# Patient Record
Sex: Male | Born: 1985 | Race: White | Hispanic: No | Marital: Married | Smoking: Never smoker
Health system: Southern US, Community
[De-identification: ages and names within clinical notes are randomized; demographics above are authoritative.]

## PROBLEM LIST (undated history)

## (undated) DIAGNOSIS — I82409 Acute embolism and thrombosis of unspecified deep veins of unspecified lower extremity: Secondary | ICD-10-CM

---

## 2007-01-23 HISTORY — PX: APPENDECTOMY: SHX54

## 2007-10-17 ENCOUNTER — Ambulatory Visit (HOSPITAL_BASED_OUTPATIENT_CLINIC_OR_DEPARTMENT_OTHER): Admission: RE | Admit: 2007-10-17 | Discharge: 2007-10-17 | Payer: Self-pay | Admitting: Urology

## 2010-06-06 NOTE — Op Note (Signed)
NAMEGILMAN, Jeffery Ferguson               ACCOUNT NO.:  [id number]   MEDICAL RECORD NO.:  [id number]          PATIENT TYPE:  AMB   LOCATION:  [facility]                         FACILITY:  [facility]   PHYSICIAN:  Ronald L. Earlene Plater, M.D.  DATE OF BIRTH:  [birth year]   DATE OF PROCEDURE:  10/17/2007  DATE OF DISCHARGE:                               OPERATIVE REPORT   DIAGNOSIS:  Urgency, frequency and bladder pain.   OPERATIVE PROCEDURE:  Cystourethroscopy.   SURGEON:  Gaynelle Arabian, M.D.   ANESTHESIA:  LMA.   ESTIMATED BLOOD LOSS:  Negligible.   TUBES:  None.   COMPLICATIONS:  None.   INDICATIONS FOR PROCEDURE:  [name] is a very nice 25 year old white male  who had a recent appendectomy.  He notes that he has had what he thinks  is latex allergy and had a latex Foley placed which was in April 2009.  He developed postoperative urinary retention.  He had 1.2 liters.  He  had to be catheterized.  Since then, he has urinated with good flow.  However, he has had some urgency, frequency and significant suprapubic  pressure and bladder pain.  Understanding risks, benefits and  alternatives, he has elected to proceed with cystourethroscopy, and he  was referred to have it done under anesthesia.   PROCEDURE IN DETAIL:  The patient was placed in supine position, after  proper LMA anesthesia, he was placed in a dorsal lithotomy position and  prepped and draped with Betadine in a sterile fashion.  Cystourethroscopy was performed with a 22.5 [place] Olympus panendoscope.  Urethra appeared to be normal.  Sphincter was normal.  Prostate was not  enlarged.  Bladder was smooth-walled.  Efflux of clear urine was noted  from normally placed ureteral orifices bilaterally.  There were no  lesions in the bladder or in the urethra.  The bladder was drained.  The  panendoscope was removed.  The patient was taken to recovery room  stable.  Plan is to place the patient on Cipro for 10 days and  Prosed/DS.  He may have a  low-grade prostatitis.  Prostate did not  appear to be inflamed but was slightly edematous which may have been  most particularly artifactual.  The patient tolerated the procedure  well.  Taken to recovery room stable.      Ronald L. Earlene Plater, M.D.  Electronically Signed     RLD/MEDQ  D:  10/17/2007  T:  10/18/2007  Job:  [id number]

## 2010-10-23 LAB — POCT HEMOGLOBIN-HEMACUE: Hemoglobin: 16.5

## 2011-05-28 DIAGNOSIS — M7989 Other specified soft tissue disorders: Secondary | ICD-10-CM | POA: Insufficient documentation

## 2011-05-28 DIAGNOSIS — H9319 Tinnitus, unspecified ear: Secondary | ICD-10-CM | POA: Insufficient documentation

## 2012-02-18 DIAGNOSIS — M545 Low back pain, unspecified: Secondary | ICD-10-CM | POA: Insufficient documentation

## 2012-02-18 DIAGNOSIS — M5430 Sciatica, unspecified side: Secondary | ICD-10-CM | POA: Insufficient documentation

## 2012-04-25 DIAGNOSIS — G5700 Lesion of sciatic nerve, unspecified lower limb: Secondary | ICD-10-CM | POA: Insufficient documentation

## 2012-04-25 DIAGNOSIS — J301 Allergic rhinitis due to pollen: Secondary | ICD-10-CM | POA: Insufficient documentation

## 2012-11-07 DIAGNOSIS — M26609 Unspecified temporomandibular joint disorder, unspecified side: Secondary | ICD-10-CM | POA: Insufficient documentation

## 2012-11-07 DIAGNOSIS — G4763 Sleep related bruxism: Secondary | ICD-10-CM | POA: Insufficient documentation

## 2012-11-07 DIAGNOSIS — G5700 Lesion of sciatic nerve, unspecified lower limb: Secondary | ICD-10-CM | POA: Insufficient documentation

## 2015-07-12 ENCOUNTER — Other Ambulatory Visit: Payer: Self-pay | Admitting: Specialist

## 2015-07-12 DIAGNOSIS — M5126 Other intervertebral disc displacement, lumbar region: Secondary | ICD-10-CM

## 2015-07-21 ENCOUNTER — Ambulatory Visit
Admission: RE | Admit: 2015-07-21 | Discharge: 2015-07-21 | Disposition: A | Discharge status: Home / Self Care | Payer: Commercial Managed Care - PPO | Source: Ambulatory Visit | Attending: Specialist | Admitting: Specialist

## 2015-07-21 ENCOUNTER — Other Ambulatory Visit: Payer: Self-pay | Admitting: Specialist

## 2015-07-21 ENCOUNTER — Inpatient Hospital Stay
Admission: RE | Admit: 2015-07-21 | Discharge: 2015-07-21 | Disposition: A | Discharge status: Home / Self Care | Payer: Self-pay | Source: Ambulatory Visit | Attending: Specialist | Admitting: Specialist

## 2015-07-21 DIAGNOSIS — R52 Pain, unspecified: Secondary | ICD-10-CM

## 2015-07-21 DIAGNOSIS — M5126 Other intervertebral disc displacement, lumbar region: Secondary | ICD-10-CM

## 2015-07-21 MED ORDER — IOPAMIDOL (ISOVUE-M 200) INJECTION 41%
15.0000 mL | Freq: Once | INTRAMUSCULAR | Status: AC
  Administered 2015-07-21: 15 mL via INTRATHECAL

## 2015-07-21 MED ORDER — MEPERIDINE HCL 100 MG/ML IJ SOLN
100.0000 mg | Freq: Once | INTRAMUSCULAR | Status: AC
  Administered 2015-07-21: 100 mg via INTRAMUSCULAR

## 2015-07-21 MED ORDER — ONDANSETRON HCL 4 MG/2ML IJ SOLN
4.0000 mg | Freq: Once | INTRAMUSCULAR | Status: AC
  Administered 2015-07-21: 4 mg via INTRAMUSCULAR

## 2015-07-21 MED ORDER — DIAZEPAM 5 MG PO TABS
10.0000 mg | ORAL_TABLET | Freq: Once | ORAL | Status: AC
  Administered 2015-07-21: 10 mg via ORAL

## 2015-07-21 NOTE — Discharge Instructions (Signed)

## 2015-08-02 ENCOUNTER — Other Ambulatory Visit: Payer: Self-pay | Admitting: Specialist

## 2015-08-02 DIAGNOSIS — M5126 Other intervertebral disc displacement, lumbar region: Secondary | ICD-10-CM

## 2015-08-03 ENCOUNTER — Ambulatory Visit
Admission: RE | Admit: 2015-08-03 | Discharge: 2015-08-03 | Disposition: A | Discharge status: Home / Self Care | Payer: Commercial Managed Care - PPO | Source: Ambulatory Visit | Attending: Specialist | Admitting: Specialist

## 2015-08-03 DIAGNOSIS — M5126 Other intervertebral disc displacement, lumbar region: Secondary | ICD-10-CM

## 2015-08-03 MED ORDER — METHYLPREDNISOLONE ACETATE 40 MG/ML INJ SUSP (RADIOLOG
120.0000 mg | Freq: Once | INTRAMUSCULAR | Status: AC
  Administered 2015-08-03: 120 mg via EPIDURAL

## 2015-08-03 MED ORDER — IOPAMIDOL (ISOVUE-M 200) INJECTION 41%
1.0000 mL | Freq: Once | INTRAMUSCULAR | Status: AC
  Administered 2015-08-03: 1 mL via EPIDURAL

## 2015-08-03 NOTE — Discharge Instructions (Signed)

## 2015-10-17 ENCOUNTER — Inpatient Hospital Stay (HOSPITAL_COMMUNITY)
Admission: EM | Admit: 2015-10-17 | Discharge: 2015-10-24 | DRG: 099 | Disposition: A | Discharge status: Home / Self Care | Payer: Commercial Managed Care - PPO | Attending: Internal Medicine | Admitting: Internal Medicine

## 2015-10-17 ENCOUNTER — Encounter (HOSPITAL_COMMUNITY): Payer: Self-pay | Admitting: Emergency Medicine

## 2015-10-17 ENCOUNTER — Emergency Department (HOSPITAL_COMMUNITY): Payer: Commercial Managed Care - PPO

## 2015-10-17 DIAGNOSIS — G0490 Encephalitis and encephalomyelitis, unspecified: Secondary | ICD-10-CM | POA: Diagnosis present

## 2015-10-17 DIAGNOSIS — Z881 Allergy status to other antibiotic agents status: Secondary | ICD-10-CM

## 2015-10-17 DIAGNOSIS — G629 Polyneuropathy, unspecified: Secondary | ICD-10-CM | POA: Diagnosis present

## 2015-10-17 DIAGNOSIS — G039 Meningitis, unspecified: Secondary | ICD-10-CM | POA: Diagnosis not present

## 2015-10-17 DIAGNOSIS — G971 Other reaction to spinal and lumbar puncture: Secondary | ICD-10-CM | POA: Diagnosis not present

## 2015-10-17 DIAGNOSIS — Y844 Aspiration of fluid as the cause of abnormal reaction of the patient, or of later complication, without mention of misadventure at the time of the procedure: Secondary | ICD-10-CM | POA: Diagnosis not present

## 2015-10-17 DIAGNOSIS — Z86718 Personal history of other venous thrombosis and embolism: Secondary | ICD-10-CM

## 2015-10-17 DIAGNOSIS — Z79899 Other long term (current) drug therapy: Secondary | ICD-10-CM

## 2015-10-17 DIAGNOSIS — Z880 Allergy status to penicillin: Secondary | ICD-10-CM

## 2015-10-17 DIAGNOSIS — Z720 Tobacco use: Secondary | ICD-10-CM

## 2015-10-17 HISTORY — DX: Acute embolism and thrombosis of unspecified deep veins of unspecified lower extremity: I82.409

## 2015-10-17 LAB — COMPREHENSIVE METABOLIC PANEL
ALK PHOS: 64 U/L (ref 38–126)
ALT: 35 U/L (ref 17–63)
AST: 30 U/L (ref 15–41)
Albumin: 4.4 g/dL (ref 3.5–5.0)
Anion gap: 10 (ref 5–15)
BILIRUBIN TOTAL: 0.8 mg/dL (ref 0.3–1.2)
BUN: 12 mg/dL (ref 6–20)
CALCIUM: 9.6 mg/dL (ref 8.9–10.3)
CHLORIDE: 105 mmol/L (ref 101–111)
CO2: 24 mmol/L (ref 22–32)
CREATININE: 0.85 mg/dL (ref 0.61–1.24)
Glucose, Bld: 104 mg/dL — ABNORMAL HIGH (ref 65–99)
Potassium: 4.1 mmol/L (ref 3.5–5.1)
Sodium: 139 mmol/L (ref 135–145)
Total Protein: 7 g/dL (ref 6.5–8.1)

## 2015-10-17 LAB — URINALYSIS, ROUTINE W REFLEX MICROSCOPIC
BILIRUBIN URINE: NEGATIVE
Glucose, UA: NEGATIVE mg/dL
HGB URINE DIPSTICK: NEGATIVE
Ketones, ur: NEGATIVE mg/dL
Leukocytes, UA: NEGATIVE
NITRITE: NEGATIVE
PROTEIN: NEGATIVE mg/dL
Specific Gravity, Urine: 1.025 (ref 1.005–1.030)
pH: 8 (ref 5.0–8.0)

## 2015-10-17 LAB — CSF CELL COUNT WITH DIFFERENTIAL
Eosinophils, CSF: 0 % (ref 0–1)
Eosinophils, CSF: 0 % (ref 0–1)
Lymphs, CSF: 4 % — ABNORMAL LOW (ref 40–80)
Lymphs, CSF: 9 % — ABNORMAL LOW (ref 40–80)
Monocyte-Macrophage-Spinal Fluid: 7 % — ABNORMAL LOW (ref 15–45)
Monocyte-Macrophage-Spinal Fluid: 7 % — ABNORMAL LOW (ref 15–45)
RBC Count, CSF: 1 /mm3 — ABNORMAL HIGH
RBC Count, CSF: 1 /mm3 — ABNORMAL HIGH
Segmented Neutrophils-CSF: 84 % — ABNORMAL HIGH (ref 0–6)
Segmented Neutrophils-CSF: 89 % — ABNORMAL HIGH (ref 0–6)
Tube #: 1
Tube #: 4
WBC, CSF: 24 /mm3 (ref 0–5)
WBC, CSF: 24 /mm3 (ref 0–5)

## 2015-10-17 LAB — CBC WITH DIFFERENTIAL/PLATELET
BASOS PCT: 0 %
Basophils Absolute: 0 10*3/uL (ref 0.0–0.1)
EOS ABS: 0 10*3/uL (ref 0.0–0.7)
EOS PCT: 0 %
HCT: 46.5 % (ref 39.0–52.0)
Hemoglobin: 15.7 g/dL (ref 13.0–17.0)
LYMPHS ABS: 1.9 10*3/uL (ref 0.7–4.0)
Lymphocytes Relative: 20 %
MCH: 30.3 pg (ref 26.0–34.0)
MCHC: 33.8 g/dL (ref 30.0–36.0)
MCV: 89.6 fL (ref 78.0–100.0)
Monocytes Absolute: 0.5 10*3/uL (ref 0.1–1.0)
Monocytes Relative: 6 %
Neutro Abs: 7 10*3/uL (ref 1.7–7.7)
Neutrophils Relative %: 74 %
PLATELETS: 231 10*3/uL (ref 150–400)
RBC: 5.19 MIL/uL (ref 4.22–5.81)
RDW: 12.7 % (ref 11.5–15.5)
WBC: 9.5 10*3/uL (ref 4.0–10.5)

## 2015-10-17 LAB — PROTEIN, CSF: Total  Protein, CSF: 54 mg/dL — ABNORMAL HIGH (ref 15–45)

## 2015-10-17 LAB — GLUCOSE, CSF: Glucose, CSF: 59 mg/dL (ref 40–70)

## 2015-10-17 LAB — I-STAT CG4 LACTIC ACID, ED: Lactic Acid, Venous: 1.68 mmol/L (ref 0.5–1.9)

## 2015-10-17 MED ORDER — PROCHLORPERAZINE EDISYLATE 5 MG/ML IJ SOLN
10.0000 mg | Freq: Once | INTRAMUSCULAR | Status: AC
  Administered 2015-10-17: 10 mg via INTRAVENOUS
  Filled 2015-10-17: qty 2

## 2015-10-17 MED ORDER — SODIUM CHLORIDE 0.9 % IV SOLN
1.0000 g | Freq: Once | INTRAVENOUS | Status: DC
  Filled 2015-10-17: qty 1

## 2015-10-17 MED ORDER — KETOROLAC TROMETHAMINE 15 MG/ML IJ SOLN
15.0000 mg | Freq: Once | INTRAMUSCULAR | Status: AC
  Administered 2015-10-17: 15 mg via INTRAVENOUS
  Filled 2015-10-17: qty 1

## 2015-10-17 MED ORDER — VANCOMYCIN HCL 10 G IV SOLR
1500.0000 mg | Freq: Once | INTRAVENOUS | Status: AC
  Administered 2015-10-17: 1500 mg via INTRAVENOUS
  Filled 2015-10-17: qty 1500

## 2015-10-17 MED ORDER — LIDOCAINE HCL (PF) 1 % IJ SOLN
5.0000 mL | Freq: Once | INTRAMUSCULAR | Status: AC
  Administered 2015-10-17: 5 mL
  Filled 2015-10-17: qty 5

## 2015-10-17 MED ORDER — SODIUM CHLORIDE 0.9 % IV BOLUS (SEPSIS)
1000.0000 mL | Freq: Once | INTRAVENOUS | Status: AC
  Administered 2015-10-17: 1000 mL via INTRAVENOUS

## 2015-10-17 MED ORDER — DEXAMETHASONE SODIUM PHOSPHATE 10 MG/ML IJ SOLN
10.0000 mg | Freq: Once | INTRAMUSCULAR | Status: AC
  Administered 2015-10-17: 10 mg via INTRAVENOUS
  Filled 2015-10-17: qty 1

## 2015-10-17 MED ORDER — SODIUM CHLORIDE 0.9 % IV SOLN
2.0000 g | Freq: Once | INTRAVENOUS | Status: AC
  Administered 2015-10-18: 2 g via INTRAVENOUS
  Filled 2015-10-17: qty 2

## 2015-10-17 MED ORDER — DIPHENHYDRAMINE HCL 50 MG/ML IJ SOLN
25.0000 mg | Freq: Once | INTRAMUSCULAR | Status: AC
  Administered 2015-10-17: 25 mg via INTRAVENOUS
  Filled 2015-10-17: qty 1

## 2015-10-17 NOTE — ED Triage Notes (Signed)
Pt sts frontal HA with N/V and some chills starting this am; pt had recent hx of strep

## 2015-10-17 NOTE — ED Provider Notes (Addendum)
[facility]-EMERGENCY DEPT Provider Note   CSN: [id number] Arrival date & time: 10/17/15  1758  By signing my name below, I, Jeffery Ferguson, attest that this documentation has been prepared under the direction and in the presence of Jeffery Razor, MD . Electronically Signed: Freida Ferguson, Scribe. 10/17/2015. 8:12 PM.   History   Chief Complaint Chief Complaint  Patient presents with  . Headache    The history is provided by the patient. No language interpreter was used.     HPI Comments:  Jeffery Ferguson is a 30 y.o. male who presents to the Emergency Department complaining of progressively worsening HA that he woke up with this AM ~ 0430. He describes a pulsing pain to the front of his head. He also he notes eye pain when he looks around. Pt went to Urgent Care this afternoon for his pain. He received a shot of Toradol which provided no relief and was advised to come to the ED for further evaluation.  Pt reports associated photophobia, 2 episodes of vomiting today, an episode of dizziness, and chills. He states he has been eating and drinking as he normally would. Mother reports syncopal episode this afternoon that lasted a few minutes. Pt denies fever, vision change, and sore throat. He has been off blood thinners x 2 months and recently finished a 5 day course of amoxicillin for strep.    Past Medical History:  Diagnosis Date  . DVT (deep venous thrombosis) [facility])     Patient Active Problem List   Diagnosis Date Noted  . 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 Piriformis syndrome 11/07/2012  . 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 Hay fever 04/25/2012  . Lesion of sciatic nerve 04/25/2012  . LBP (low back pain) 02/18/2012  . Neuralgia neuritis, sciatic nerve 02/18/2012  . Soft tissue lesion of shoulder region 05/28/2011  . Buzzing in ear 05/28/2011    History reviewed. No pertinent surgical history.     Home Medications    Prior to Admission medications   Medication  Sig Start Date End Date Taking? Authorizing Provider  metaxalone (SKELAXIN) 800 MG tablet Take 800 mg by mouth at bedtime as needed for muscle spasms.    Yes Historical Provider, MD  omeprazole (PRILOSEC) 20 MG capsule Take 20 mg by mouth 2 (two) times daily. 08/11/15  Yes Historical Provider, MD  traMADol (ULTRAM) 50 MG tablet Take 100 mg by mouth 3 (three) times daily as needed for moderate pain.  08/11/15  Yes Historical Provider, MD    Family History History reviewed. No pertinent family history.  Social History Social History  Substance Use Topics  . Smoking status: Never Smoker  . Smokeless tobacco: Never Used  . Alcohol use 0.0 oz/week     Comment: occ     Allergies   Penicillins and Cephalexin   Review of Systems Review of Systems  Constitutional: Positive for chills. Negative for fever.  HENT: Negative for sore throat.   Eyes: Positive for photophobia. Negative for visual disturbance.  Gastrointestinal: Positive for nausea and vomiting.  Neurological: Positive for dizziness, syncope and headaches.  All other systems reviewed and are negative.   Physical Exam Updated Vital Signs BP 118/70   Pulse 67   Temp 99.7 F (37.6 C) (Oral)   Resp 15   SpO2 96%   Physical Exam  Constitutional: He is oriented to person, place, and time. He appears well-developed and well-nourished.  Pt laying in bed with lights off; appears uncomfortable  HENT:  Head: Normocephalic  and atraumatic.  Mouth/Throat: Oropharynx is clear and moist. No oropharyngeal exudate.  Eyes: Conjunctivae and EOM are normal. Pupils are equal, round, and reactive to light. Right eye exhibits no discharge. Left eye exhibits no discharge.  Wouldn't tolerate fundoscopic exam  Neck: Normal range of motion. Neck supple. No neck rigidity. No Brudzinski's sign and no Kernig's sign noted.  Cardiovascular: Normal rate, regular rhythm and normal heart sounds.   No murmur heard. Pulmonary/Chest: Effort normal and  breath sounds normal. No respiratory distress.  Abdominal: Soft. He exhibits no distension. There is no tenderness.  Lymphadenopathy:    He has no cervical adenopathy.  Neurological: He is alert and oriented to person, place, and time. No cranial nerve deficit. He exhibits normal muscle tone. Coordination normal.  Normal strength and sensation to all extremities  Skin: Skin is warm and dry.  Psychiatric: He has a normal mood and affect. His behavior is normal.  Nursing note and vitals reviewed.    ED Treatments / Results  DIAGNOSTIC STUDIES:  Oxygen Saturation is 96% on RA, normal by my interpretation.    COORDINATION OF CARE:  8:06 PM Discussed risks and benefits of LP with pt and family at bedside and pt agrees to treatment plan.   Labs (all labs ordered are listed, but only abnormal results are displayed) Labs Reviewed  COMPREHENSIVE METABOLIC PANEL - Abnormal; Notable for the following:       Result Value   Glucose, Bld 104 (*)    All other components within normal limits  CSF CELL COUNT WITH DIFFERENTIAL - Abnormal; Notable for the following:    RBC Count, CSF 1 (*)    WBC, CSF 24 (*)    Segmented Neutrophils-CSF 84 (*)    Lymphs, CSF 9 (*)    Monocyte-Macrophage-Spinal Fluid 7 (*)    All other components within normal limits  CSF CELL COUNT WITH DIFFERENTIAL - Abnormal; Notable for the following:    RBC Count, CSF 1 (*)    WBC, CSF 24 (*)    Segmented Neutrophils-CSF 89 (*)    Lymphs, CSF 4 (*)    Monocyte-Macrophage-Spinal Fluid 7 (*)    All other components within normal limits  PROTEIN, CSF - Abnormal; Notable for the following:    Total  Protein, CSF 54 (*)    All other components within normal limits  CSF CULTURE  GRAM STAIN  CBC WITH DIFFERENTIAL/PLATELET  URINALYSIS, ROUTINE W REFLEX MICROSCOPIC (NOT AT [facility])  GLUCOSE, CSF  RAPID HIV SCREEN (HIV 1/2 AB+AG)  VDRL, CSF  HERPES SIMPLEX VIRUS(HSV) DNA BY PCR  CRYPTOCOCCAL ANTIGEN, CSF  HIV 1 RNA QUANT-NO  REFLEX-BLD  I-STAT CG4 LACTIC ACID, ED    EKG  EKG Interpretation  Date/Time:  Monday October 17 2015 20:15:33 EDT Ventricular Rate:  74 PR Interval:    QRS Duration: 93 QT Interval:  379 QTC Calculation: 421 R Axis:   82 Text Interpretation:  Sinus rhythm No old tracing to compare Confirmed by Ksenia Kunz  MD, Tinia Oravec [phone]) on 10/17/2015 8:34:49 PM       Radiology Ct Head Wo Contrast  Result Date: 10/17/2015 CLINICAL DATA:  30 y/o M; headache with chills, nausea, and vomiting. EXAM: CT HEAD WITHOUT CONTRAST TECHNIQUE: Contiguous axial images were obtained from the base of the skull through the vertex without intravenous contrast. COMPARISON:  None. FINDINGS: Brain: No evidence of acute infarction, hemorrhage, hydrocephalus, extra-axial collection or mass lesion/mass effect. Vascular: No hyperdense vessel or unexpected calcification. Skull: Normal. Negative for fracture  or focal lesion. Sinuses/Orbits: No acute finding. Other: None. IMPRESSION: No acute intracranial abnormality is identified. Unremarkable CT of head for age. Electronically Signed   By: Mitzi HansenLance  Furusawa-Stratton M.D.   On: 10/17/2015 20:17    Procedures .Lumbar Puncture Date/Time: 10/17/2015 9:15 PM Performed by: Jeffery RazorKOHUT, Asenath Balash Authorized by: Jeffery RazorKOHUT, Rebel Willcutt   Consent:    Consent obtained:  Verbal   Consent given by:  Patient   Risks discussed:  Pain, infection, bleeding and headache Pre-procedure details:    Procedure purpose:  Diagnostic Anesthesia (see MAR for exact dosages):    Anesthesia method:  Local infiltration   Local anesthetic:  Lidocaine 1% w/o epi Procedure details:    Lumbar space:  L3-L4 interspace   Patient position:  L lateral decubitus   Needle gauge:  22   Fluid appearance:  Clear   Tubes of fluid:  4   Total volume (ml):  6 Post-procedure:    Puncture site:  Adhesive bandage applied   Patient tolerance of procedure:  Tolerated well, no immediate complications     CRITICAL  CARE Performed by: Jeffery RazorKOHUT, Pratham Cassatt Total critical care time: 35 minutes Critical care time was exclusive of separately billable procedures and treating other patients. Critical care was necessary to treat or prevent imminent or life-threatening deterioration. Critical care was time spent personally by me on the following activities: development of treatment plan with patient and/or surrogate as well as nursing, discussions with consultants, evaluation of patient's response to treatment, examination of patient, obtaining history from patient or surrogate, ordering and performing treatments and interventions, ordering and review of laboratory studies, ordering and review of radiographic studies, pulse oximetry and re-evaluation of patient's condition.  Medications Ordered in ED Medications  vancomycin (VANCOCIN) 1,500 mg in sodium chloride 0.9 % 500 mL IVPB (1,500 mg Intravenous New Bag/Given 10/17/15 2322)  meropenem (MERREM) 2 g in sodium chloride 0.9 % 100 mL IVPB (not administered)  sodium chloride 0.9 % bolus 1,000 mL (0 mLs Intravenous Stopped 10/17/15 2059)  diphenhydrAMINE (BENADRYL) injection 25 mg (25 mg Intravenous Given 10/17/15 2025)  prochlorperazine (COMPAZINE) injection 10 mg (10 mg Intravenous Given 10/17/15 2023)  ketorolac (TORADOL) 15 MG/ML injection 15 mg (15 mg Intravenous Given 10/17/15 2024)  lidocaine (PF) (XYLOCAINE) 1 % injection 5 mL (5 mLs Other Given 10/17/15 2059)  dexamethasone (DECADRON) injection 10 mg (10 mg Intravenous Given 10/17/15 2319)     Initial Impression / Assessment and Plan / ED Course  I have reviewed the triage vital signs and the nursing notes.  Pertinent labs & imaging results that were available during my care of the patient were reviewed by me and considered in my medical decision making (see chart for details).  Clinical Course    30yM with HA. Nonfocal neuro exam. Subjectively reports increased pain in occipital region with ranging neck and  abducting shoulders, but no rigidity on exam. Afebrile but reported NSAIDs prior to coming to ED. CT head negative.  LP with elevated WBCs. Only 24, but neutrophils 84/89. Protein mildly elevated.  CSF glucose normal at ~2/3 blood level. I'm not sure how much 5d of amoxicillin ending 8 days ago effects things, if at all.  Will cover for possible bacterial meningitis. Keflex allergy listed as hives. Discussed with pharmacy and recommending meropenem. Also vanc and decadron. Will discuss with ID. Lives with 9w, 2y children and wife.   Discussed with ID, Dr Algis LimingVanDam. Pt uncomfortable, but not toxic. CSF WBC only 24. Amoxicillin finished 8 days ago is probably  a non-factor. Probably not bacterial meningitis. Additional studies sent per recommendations. Not recommending prophylaxis for close contacts.   Final Clinical Impressions(s) / ED Diagnoses   Final diagnoses:  Meningitis    New Prescriptions New Prescriptions   No medications on file   I personally preformed the services scribed in my presence. The recorded information has been reviewed is accurate. Jeffery Razor, MD.         Jeffery Razor, MD 10/18/15 [phone]

## 2015-10-18 ENCOUNTER — Encounter (HOSPITAL_COMMUNITY): Payer: Self-pay | Admitting: Family Medicine

## 2015-10-18 DIAGNOSIS — G0481 Other encephalitis and encephalomyelitis: Secondary | ICD-10-CM | POA: Diagnosis not present

## 2015-10-18 DIAGNOSIS — G0490 Encephalitis and encephalomyelitis, unspecified: Secondary | ICD-10-CM | POA: Diagnosis present

## 2015-10-18 DIAGNOSIS — Z720 Tobacco use: Secondary | ICD-10-CM | POA: Diagnosis not present

## 2015-10-18 DIAGNOSIS — G039 Meningitis, unspecified: Secondary | ICD-10-CM | POA: Diagnosis present

## 2015-10-18 DIAGNOSIS — G971 Other reaction to spinal and lumbar puncture: Secondary | ICD-10-CM | POA: Diagnosis not present

## 2015-10-18 DIAGNOSIS — Z881 Allergy status to other antibiotic agents status: Secondary | ICD-10-CM | POA: Diagnosis not present

## 2015-10-18 DIAGNOSIS — G4489 Other headache syndrome: Secondary | ICD-10-CM | POA: Diagnosis not present

## 2015-10-18 DIAGNOSIS — G629 Polyneuropathy, unspecified: Secondary | ICD-10-CM | POA: Diagnosis present

## 2015-10-18 DIAGNOSIS — Z880 Allergy status to penicillin: Secondary | ICD-10-CM | POA: Diagnosis not present

## 2015-10-18 DIAGNOSIS — Z79899 Other long term (current) drug therapy: Secondary | ICD-10-CM | POA: Diagnosis not present

## 2015-10-18 DIAGNOSIS — G6289 Other specified polyneuropathies: Secondary | ICD-10-CM | POA: Diagnosis not present

## 2015-10-18 DIAGNOSIS — Z86718 Personal history of other venous thrombosis and embolism: Secondary | ICD-10-CM | POA: Diagnosis not present

## 2015-10-18 DIAGNOSIS — Y844 Aspiration of fluid as the cause of abnormal reaction of the patient, or of later complication, without mention of misadventure at the time of the procedure: Secondary | ICD-10-CM | POA: Diagnosis not present

## 2015-10-18 LAB — BASIC METABOLIC PANEL
ANION GAP: 9 (ref 5–15)
BUN: 10 mg/dL (ref 6–20)
CALCIUM: 9.3 mg/dL (ref 8.9–10.3)
CHLORIDE: 104 mmol/L (ref 101–111)
CO2: 25 mmol/L (ref 22–32)
Creatinine, Ser: 0.95 mg/dL (ref 0.61–1.24)
GFR calc Af Amer: >60 mL/min (ref >60)
GFR calc non Af Amer: >60 mL/min (ref >60)
GLUCOSE: 142 mg/dL — AB (ref 65–99)
POTASSIUM: 4.4 mmol/L (ref 3.5–5.1)
Sodium: 138 mmol/L (ref 135–145)

## 2015-10-18 LAB — CBC
HEMATOCRIT: 46 % (ref 39.0–52.0)
HEMOGLOBIN: 15.7 g/dL (ref 13.0–17.0)
MCH: 30.9 pg (ref 26.0–34.0)
MCHC: 34.1 g/dL (ref 30.0–36.0)
MCV: 90.6 fL (ref 78.0–100.0)
Platelets: 229 10*3/uL (ref 150–400)
RBC: 5.08 MIL/uL (ref 4.22–5.81)
RDW: 12.9 % (ref 11.5–15.5)
WBC: 8.5 10*3/uL (ref 4.0–10.5)

## 2015-10-18 LAB — RAPID HIV SCREEN (HIV 1/2 AB+AG)
HIV 1/2 Antibodies: NONREACTIVE
HIV-1 P24 ANTIGEN - HIV24: NONREACTIVE

## 2015-10-18 LAB — CRYPTOCOCCAL ANTIGEN, CSF: Crypto Ag: NEGATIVE

## 2015-10-18 LAB — PATHOLOGIST SMEAR REVIEW: Path Review: INCREASED

## 2015-10-18 MED ORDER — ACETAMINOPHEN 650 MG RE SUPP
650.0000 mg | Freq: Four times a day (QID) | RECTAL | Status: DC | PRN
Start: 2015-10-18 — End: 2015-10-24

## 2015-10-18 MED ORDER — KETOROLAC TROMETHAMINE 15 MG/ML IJ SOLN
15.0000 mg | Freq: Four times a day (QID) | INTRAMUSCULAR | Status: DC | PRN
  Administered 2015-10-18 – 2015-10-19 (×4): 15 mg via INTRAVENOUS
  Filled 2015-10-18 (×4): qty 1

## 2015-10-18 MED ORDER — ONDANSETRON HCL 4 MG/2ML IJ SOLN
4.0000 mg | Freq: Four times a day (QID) | INTRAMUSCULAR | Status: DC | PRN
  Administered 2015-10-21 – 2015-10-24 (×4): 4 mg via INTRAVENOUS
  Filled 2015-10-18 (×4): qty 2

## 2015-10-18 MED ORDER — ONDANSETRON HCL 4 MG PO TABS: 4.0000 mg | ORAL_TABLET | Freq: Four times a day (QID) | ORAL | Status: DC | PRN

## 2015-10-18 MED ORDER — ENOXAPARIN SODIUM 40 MG/0.4ML ~~LOC~~ SOLN
40.0000 mg | SUBCUTANEOUS | Status: DC
  Administered 2015-10-18 – 2015-10-23 (×6): 40 mg via SUBCUTANEOUS
  Filled 2015-10-18 (×7): qty 0.4

## 2015-10-18 MED ORDER — INFLUENZA VAC SPLIT QUAD 0.5 ML IM SUSY
0.5000 mL | PREFILLED_SYRINGE | INTRAMUSCULAR | Status: AC
  Administered 2015-10-19: 0.5 mL via INTRAMUSCULAR
  Filled 2015-10-18: qty 0.5

## 2015-10-18 MED ORDER — ACETAMINOPHEN 325 MG PO TABS
650.0000 mg | ORAL_TABLET | Freq: Four times a day (QID) | ORAL | Status: DC | PRN
  Administered 2015-10-18 – 2015-10-24 (×7): 650 mg via ORAL
  Filled 2015-10-18 (×7): qty 2

## 2015-10-18 MED ORDER — VANCOMYCIN HCL IN DEXTROSE 1-5 GM/200ML-% IV SOLN
1000.0000 mg | Freq: Three times a day (TID) | INTRAVENOUS | Status: DC
  Administered 2015-10-18 – 2015-10-21 (×9): 1000 mg via INTRAVENOUS
  Filled 2015-10-18 (×13): qty 200

## 2015-10-18 MED ORDER — SENNOSIDES-DOCUSATE SODIUM 8.6-50 MG PO TABS
1.0000 | ORAL_TABLET | Freq: Every evening | ORAL | Status: DC | PRN
  Administered 2015-10-20: 1 via ORAL
  Filled 2015-10-18: qty 1

## 2015-10-18 MED ORDER — IBUPROFEN 600 MG PO TABS
600.0000 mg | ORAL_TABLET | Freq: Four times a day (QID) | ORAL | Status: DC | PRN
  Administered 2015-10-18: 600 mg via ORAL
  Filled 2015-10-18: qty 1

## 2015-10-18 MED ORDER — SODIUM CHLORIDE 0.9 % IV SOLN
2.0000 g | Freq: Three times a day (TID) | INTRAVENOUS | Status: DC
  Administered 2015-10-18 – 2015-10-24 (×16): 2 g via INTRAVENOUS
  Filled 2015-10-18 (×24): qty 2

## 2015-10-18 MED ORDER — DIPHENHYDRAMINE HCL 25 MG PO CAPS
25.0000 mg | ORAL_CAPSULE | Freq: Every evening | ORAL | Status: DC | PRN
  Administered 2015-10-19 – 2015-10-24 (×2): 25 mg via ORAL
  Filled 2015-10-18 (×4): qty 1

## 2015-10-18 MED ORDER — OXYCODONE HCL 5 MG PO TABS
5.0000 mg | ORAL_TABLET | Freq: Four times a day (QID) | ORAL | Status: DC | PRN
  Administered 2015-10-18 – 2015-10-19 (×3): 5 mg via ORAL
  Filled 2015-10-18 (×3): qty 1

## 2015-10-18 NOTE — Progress Notes (Signed)
Patient admitted earlier this morning. Patient seen and examined. H&P reviewed.  S: Patient continues to have headache in the frontal part of his head about 7 out of 10 in intensity. He has associated photophobia. He has some nausea. Neck pain is present with bending of neck. Denies any shortness of breath. Denies any joint pain, skin rashes. No dysuria.  O: Vital signs reviewed. Temperature was 100.4 earlier today. Blood pressure, heart rate and oxygen saturations are normal.  Extraocular movements are intact. Some restriction in range of motion of the neck. Lungs are clear to auscultation bilaterally with no wheezing, rales or rhonchi. S1, S2 is normal, regular. No S3, S4. No rubs, murmurs or bruit. Abdomen is soft, nontender, nondistended. Bowel sounds are present. No masses or organomegaly. Neurologically patient is awake, alert. Oriented 3. Cranial nerves II-12 intact. No focal neurological deficits appreciated.  Labs reviewed. CSF culture is pending.  A/P: Patient admitted for further evaluation of severe headaches and fever. CSF was abnormal suggesting meningitis. Discussed with the ID physician. Patient is on vancomycin and meropenem, which will be continued. Workup is in progress. Cultures are pending. Symptomatic treatment for now.  We'll continue to monitor and follow.  Osvaldo ShipperKRISHNAN,Carlson Belland 10/18/2015

## 2015-10-18 NOTE — Progress Notes (Signed)
Pharmacy Antibiotic Note  Jeffery Ferguson is a 30 y.o. male admitted on 10/17/2015 with possible meningitis.  Pharmacy has been consulted for Vancocin and Merrem dosing.  Plan: Vancomycin 1500mg  in ED then 1000mg  IV every 8 hours.  Goal trough 15-20 mcg/mL.  Merrem 2g IV every 8 hours.  Height: 6\' 1"  (185.4 cm) Weight: 210 lb 1.6 oz (95.3 kg) IBW/kg (Calculated) : 79.9  Temp (24hrs), Avg:99.4 F (37.4 C), Min:99.1 F (37.3 C), Max:99.7 F (37.6 C)   Recent Labs Lab 10/17/15 1813 10/17/15 1845  WBC 9.5  --   CREATININE 0.85  --   LATICACIDVEN  --  1.68    Estimated Creatinine Clearance: 144.9 mL/min (by C-G formula based on SCr of 0.85 mg/dL).    Allergies  Allergen Reactions  . Penicillins Nausea And Vomiting    Has patient had a PCN reaction causing immediate rash, facial/tongue/throat swelling, SOB or lightheadedness with hypotension: Yes Has patient had a PCN reaction causing severe rash involving mucus membranes or skin necrosis: No Has patient had a PCN reaction that required hospitalization No Has patient had a PCN reaction occurring within the last 10 years: No If all of the above answers are "NO", then may proceed with Cephalosporin use.   . Cephalexin Hives     Thank you for allowing pharmacy to be a part of this patient's care.  Vernard GamblesVeronda Fox Salminen, PharmD, BCPS  10/18/2015 1:10 AM

## 2015-10-18 NOTE — H&P (Signed)
History and Physical  Patient Name: Jeffery CoveCal Dunkel     WGN:[id number]    DOB: 08/24/1985    DOA: 10/17/2015 PCP: No primary care provider on file.   Patient coming from: Home  Chief Complaint: Headache and vomiting  HPI: Jeffery Ferguson is a 30 y.o. male with no significant past medical history who presents with headache for 1 day.  History is collected mostly from the patient's father, at the bedside, as the patient is sleepy from diphenhydramine. Father reports that the patient [place], and has had care over the last month from several different urgent cares and emergency rooms. About 2 weeks ago, he was seen somewhere for sore throat, diagnosed with strep, and prescribed amoxicillin. A few days later, he was seen in the ER for severe chest pain, had "CT scans" at other workup that was all negative, and was diagnosed with pleurisy, which the father was given a believe was from the strep throat.  Since then, the patient has completed amoxicillin, and been otherwise well until this morning when he woke up with an posterior headache. This progressed over the course of the day until it was severe in intensity, became associated with nausea and vomiting,  chills at times, photophobia, one episode of syncope per patient's mother, and dizziness, and so he came to the hospital.  ED course: -Temp 99.9F, heart rate 102, respirations, blood pressure, pulse oximetry normal -Na 139, K 4.1, Cr 0.85, WBC 9.5K, Hgb 15, urinalysis clear, lactate normal -CT of the head was unremarkable -Lumbar puncture was performed, which showed 24 WBC/1 RBC in both tubes 1 and 4, 80% segs, normal glucose, increased protein -This was sent for culture, the patient was started on vancomycin and meropenem (because of cephalosporin allergy) and TRH were asked to evaluate for admission     ROS: Review of Systems  Constitutional: Positive for chills. Negative for fever.  HENT: Positive for  congestion. Negative for sore throat.   Eyes: Positive for photophobia.  Cardiovascular: Positive for chest pain ([facility]  Gastrointestinal: Positive for vomiting (today).  Musculoskeletal: Positive for back pain (chronic) and neck pain (new).  Skin: Negative for rash.  Neurological: Positive for dizziness and loss of consciousness (syncope today).          Past Medical History:  Diagnosis Date  . DVT (deep venous thrombosis) (HCC)     Past Surgical History:  Procedure Laterality Date  . APPENDECTOMY  2009    Social History: Patient lives with his wife and two small children.  The patient walks unasssited.  He works in International aid/development workerconstruction/engineering.  Non-smoker. No IVDU.    Allergies  Allergen Reactions  . Penicillins Nausea And Vomiting    Has patient had a PCN reaction causing immediate rash, facial/tongue/throat swelling, SOB or lightheadedness with hypotension: Yes Has patient had a PCN reaction causing severe rash involving mucus membranes or skin necrosis: No Has patient had a PCN reaction that required hospitalization No Has patient had a PCN reaction occurring within the last 10 years: No If all of the above answers are "NO", then may proceed with Cephalosporin use.   . Cephalexin Hives    Family history: family history includes Cancer in his other; Heart disease in his other.  Prior to Admission medications   Medication Sig Start Date End Date Taking? Authorizing Provider  metaxalone (SKELAXIN) 800 MG tablet Take 800 mg by mouth at bedtime as needed for muscle spasms.  Yes Historical Provider, MD  omeprazole (PRILOSEC) 20 MG capsule Take 20 mg by mouth 2 (two) times daily. 08/11/15  Yes Historical Provider, MD  traMADol (ULTRAM) 50 MG tablet Take 100 mg by mouth 3 (three) times daily as needed for moderate pain.  08/11/15  Yes Historical Provider, MD       Physical Exam: BP 115/69   Pulse 73   Temp 99.1 F (37.3 C) (Oral)   Resp 16   SpO2 98%   General appearance: Well-developed, adult male, alert and in moderate distress from pain, sleepy, eyes closed during exam.   Eyes: Anicteric, conjunctiva pink, lids and lashes normal. PERRL.    ENT: No nasal deformity, discharge, epistaxis.  Hearing normal. OP moist without lesions.   Neck: No neck masses.  Will not bend neck forward on exam.  Trachea midline.  No thyromegaly/tenderness. Lymph: No cervical or supraclavicular lymphadenopathy. Skin: Warm and dry.  No jaundice.  No suspicious rashes or lesions. Cardiac: RRR, nl S1-S2, no murmurs appreciated.  Capillary refill is brisk.  JVP normal.  No LE edema.  Radial and DP pulses 2+ and symmetric. Respiratory: Normal respiratory rate and rhythm.  CTAB without rales or wheezes. Abdomen: Abdomen soft.  No TTP or gaurding. No ascites, distension, hepatosplenomegaly.   MSK: No deformities or effusions.  No cyanosis or clubbing. Neuro: Cranial nerves intact, exam limited by cooperation.  Sensation intact to light touch. Speech is fluent.  Muscle strength normal but globally diminished.    Psych: Sensorium intact and responding to questions, attention decreased.  Behavior appropriate.  Affect blunted.     Labs on Admission:  I have personally reviewed following labs and imaging studies: CBC:  Recent Labs Lab 10/17/15 1813  WBC 9.5  NEUTROABS 7.0  HGB 15.7  HCT 46.5  MCV 89.6  PLT 231   Basic Metabolic Panel:  Recent Labs Lab 10/17/15 1813  NA 139  K 4.1  CL 105  CO2 24  GLUCOSE 104*  BUN 12  CREATININE 0.85  CALCIUM 9.6   GFR: CrCl cannot be calculated (Unknown ideal weight.).  Liver Function Tests:  Recent Labs Lab 10/17/15 1813  AST 30  ALT 35  ALKPHOS 64  BILITOT 0.8  PROT 7.0  ALBUMIN 4.4   No results for input(s): LIPASE, AMYLASE in the last 168 hours. No results for input(s): AMMONIA in the last 168 hours. Coagulation Profile: No results for input(s): INR, PROTIME in the last 168 hours. Cardiac  Enzymes: No results for input(s): CKTOTAL, CKMB, CKMBINDEX, TROPONINI in the last 168 hours. BNP (last 3 results) No results for input(s): PROBNP in the last 8760 hours. HbA1C: No results for input(s): HGBA1C in the last 72 hours. CBG: No results for input(s): GLUCAP in the last 168 hours. Lipid Profile: No results for input(s): CHOL, HDL, LDLCALC, TRIG, CHOLHDL, LDLDIRECT in the last 72 hours. Thyroid Function Tests: No results for input(s): TSH, T4TOTAL, FREET4, T3FREE, THYROIDAB in the last 72 hours. Anemia Panel: No results for input(s): VITAMINB12, FOLATE, FERRITIN, TIBC, IRON, RETICCTPCT in the last 72 hours. Sepsis Labs: Lactic acid normal Invalid input(s): PROCALCITONIN, LACTICIDVEN Recent Results (from the past 240 hour(s))  CSF culture     Status: None (Preliminary result)   Collection Time: 10/17/15  9:30 PM  Result Value Ref Range Status   Specimen Description CSF  Final   Special Requests NONE  Final   Gram Stain   Final    CYTOSPIN SMEAR WBC PRESENT,BOTH PMN AND MONONUCLEAR NO ORGANISMS  SEEN    Culture PENDING  Incomplete   Report Status PENDING  Incomplete         Radiological Exams on Admission: Personally reviewed: Ct Head Wo Contrast  Result Date: 10/17/2015 CLINICAL DATA:  30 y/o M; headache with chills, nausea, and vomiting. EXAM: CT HEAD WITHOUT CONTRAST TECHNIQUE: Contiguous axial images were obtained from the base of the skull through the vertex without intravenous contrast. COMPARISON:  None. FINDINGS: Brain: No evidence of acute infarction, hemorrhage, hydrocephalus, extra-axial collection or mass lesion/mass effect. Vascular: No hyperdense vessel or unexpected calcification. Skull: Normal. Negative for fracture or focal lesion. Sinuses/Orbits: No acute finding. Other: None. IMPRESSION: No acute intracranial abnormality is identified. Unremarkable CT of head for age. Electronically Signed   By: Mitzi Hansen M.D.   On: 10/17/2015 20:17     EKG: Independently reviewed. Rate 74, QTc 421, early repolarization pattern.    Assessment/Plan 1. Meningitis:  Viral versus bacterial.  Recent strep or viral URI.  Newborn and 79 year old child at home.  Was discussed by phone with ID, whose recommendations are included below.     -Continue vancomycin and meropenem -Consult to ID, appreciate recommendations -Check HIV -Check HSV, VDRL, crypto antigen on CSF -Follow CSF culture        DVT prophylaxis: Lovenox  Code Status: FULL  Family Communication: Father at bedsdie  Disposition Plan: Anticipate IV fliuds, IV antibiotics and follow culture data.   Consults called: ID Admission status: INAPATIENT, med surg        Medical decision making: Patient seen at 12:05 AM on 10/18/2015.  The patient was discussed with Dr. Juleen China.  What exists of the patient's chart was reviewed in depth and summarized above.  Clinical condition: stable.        Alberteen Sam [facility] Pager [phone]

## 2015-10-18 NOTE — ED Notes (Signed)
Admitting at bedside 

## 2015-10-18 NOTE — Progress Notes (Addendum)
New Admission Note:   Arrival Method: stretcher Mental Orientation: a o x 4 Telemetry:no Assessment: Completed Skin: intact IV:nsl Pain:denies Tubes:none Safety Measures: Safety Fall Prevention Plan has been given and       , discussed.  Admission: Completed [facility] Orientation: Patient has been orientated to the room, unit and staff.  Family:father at bedside  Orders have been reviewed and implemented. Will continue to monitor the patient. Call light has been placed within reach. Clement Sayresathie Jaimere Feutz, RN Phone number: [phone]

## 2015-10-19 LAB — BASIC METABOLIC PANEL
ANION GAP: 9 (ref 5–15)
BUN: 12 mg/dL (ref 6–20)
CHLORIDE: 105 mmol/L (ref 101–111)
CO2: 26 mmol/L (ref 22–32)
Calcium: 8.9 mg/dL (ref 8.9–10.3)
Creatinine, Ser: 0.92 mg/dL (ref 0.61–1.24)
GFR calc non Af Amer: >60 mL/min (ref >60)
Glucose, Bld: 114 mg/dL — ABNORMAL HIGH (ref 65–99)
POTASSIUM: 3.8 mmol/L (ref 3.5–5.1)
Sodium: 140 mmol/L (ref 135–145)

## 2015-10-19 LAB — HIV-1 RNA QUANT-NO REFLEX-BLD: LOG10 HIV-1 RNA: UNDETERMINED {Log_copies}/mL

## 2015-10-19 LAB — VDRL, CSF: VDRL Quant, CSF: NONREACTIVE

## 2015-10-19 LAB — CBC
HEMATOCRIT: 41.8 % (ref 39.0–52.0)
Hemoglobin: 14 g/dL (ref 13.0–17.0)
MCH: 30.2 pg (ref 26.0–34.0)
MCHC: 33.5 g/dL (ref 30.0–36.0)
MCV: 90.1 fL (ref 78.0–100.0)
Platelets: 198 10*3/uL (ref 150–400)
RBC: 4.64 MIL/uL (ref 4.22–5.81)
RDW: 13.1 % (ref 11.5–15.5)
WBC: 7.2 10*3/uL (ref 4.0–10.5)

## 2015-10-19 LAB — HERPES SIMPLEX VIRUS(HSV) DNA BY PCR
HSV 1 DNA: NEGATIVE
HSV 2 DNA: NEGATIVE

## 2015-10-19 MED ORDER — OXYCODONE-ACETAMINOPHEN 5-325 MG PO TABS
1.0000 | ORAL_TABLET | ORAL | Status: DC | PRN
  Administered 2015-10-19 (×2): 2 via ORAL
  Administered 2015-10-20 – 2015-10-22 (×8): 1 via ORAL
  Filled 2015-10-19: qty 2
  Filled 2015-10-19 (×3): qty 1
  Filled 2015-10-19 (×2): qty 2
  Filled 2015-10-19 (×3): qty 1
  Filled 2015-10-19: qty 2
  Filled 2015-10-19: qty 1

## 2015-10-19 NOTE — Progress Notes (Signed)
PROGRESS NOTE  Jeffery Ferguson  WUJ:[id number] DOB: July 25, 1985 DOA: 10/17/2015 PCP: No primary care provider on file. Outpatient Specialists:  Subjective: Patient seen with his father at bedside, reporting headache improving slightly. Still has some headache especially frontal headache, no neck stiffness this morning.  Brief Narrative:  Jeffery Ferguson is a 30 y.o. male with no significant past medical history who presents with headache for 1 day.  History is collected mostly from the patient's father, at the bedside, as the patient is sleepy from diphenhydramine. Father reports that the patient [place] highways [place], and has had care over the last month from several different urgent cares and emergency rooms. About 2 weeks ago, he was seen somewhere for sore throat, diagnosed with strep, and prescribed amoxicillin. A few days later, he was seen in the ER for severe chest pain, had "CT scans" at other workup that was all negative, and was diagnosed with pleurisy, which the father was given a believe was from the strep throat.  Since then, the patient has completed amoxicillin, and been otherwise well until this morning when he woke up with an posterior headache. This progressed over the course of the day until it was severe in intensity, became associated with nausea and vomiting,  chills at times, photophobia, one episode of syncope per patient's mother, and dizziness, and so he came to the hospital.  Assessment & Plan:   Principal Problem:   Meningitis   Possible meningitis -Patient presented with headaches, does not have nuchal rigidity now but he reported some on admission. -Has facial pain suggesting also sinusitis especially frontal. -Temperature of 100.4, afebrile today, continue current antibiotics. -CSF showed 24 WBCs which is 84-89% neutrophils, protein is 54 and glucose is 59. -CSF Gram stain without any organism.    DVT prophylaxis:  Code Status:  Full Code Family Communication:  Disposition Plan:  Diet: Diet regular Room service appropriate? Yes; Fluid consistency: Thin  Consultants:   ID  Procedures:   LP  Antimicrobials:   vancomycin and meropenem.   Objective: Vitals:   10/18/15 1747 10/18/15 2223 10/19/15 0547 10/19/15 1130  BP: 129/76 (!) 111/51 (!) 111/49 134/72  Pulse: 78 75 76 94  Resp: 18 18 18 20   Temp: 97.7 F (36.5 C) 98.4 F (36.9 C) 98.3 F (36.8 C) 98.4 F (36.9 C)  TempSrc: Oral Oral Oral Oral  SpO2: 97% 98% 98% 98%  Weight:  95.3 kg (210 lb 1.6 oz)    Height:        Intake/Output Summary (Last 24 hours) at 10/19/15 1320 Last data filed at 10/19/15 1100  Gross per 24 hour  Intake              600 ml  Output                0 ml  Net              600 ml   Filed Weights   10/18/15 0100 10/18/15 0146 10/18/15 2223  Weight: 95.3 kg (210 lb 1.6 oz) 93.9 kg (207 lb) 95.3 kg (210 lb 1.6 oz)    Examination: General exam: Appears calm and comfortable  Respiratory system: Clear to auscultation. Respiratory effort normal. Cardiovascular system: S1 & S2 heard, RRR. No JVD, murmurs, rubs, gallops or clicks. No pedal edema. Gastrointestinal system: Abdomen is nondistended, soft and nontender. No organomegaly or masses felt. Normal bowel sounds heard. Central nervous system: Alert and oriented. No focal neurological  deficits. Extremities: Symmetric 5 x 5 power. Skin: No rashes, lesions or ulcers Psychiatry: Judgement and insight appear normal. Mood & affect appropriate.   Data Reviewed: I have personally reviewed following labs and imaging studies  CBC:  Recent Labs Lab 10/17/15 1813 10/18/15 0357 10/19/15 0540  WBC 9.5 8.5 7.2  NEUTROABS 7.0  --   --   HGB 15.7 15.7 14.0  HCT 46.5 46.0 41.8  MCV 89.6 90.6 90.1  PLT 231 229 198   Basic Metabolic Panel:  Recent Labs Lab 10/17/15 1813 10/18/15 0357 10/19/15 0540  NA 139 138 140  K 4.1 4.4 3.8  CL 105 104 105  CO2 24 25 26     GLUCOSE 104* 142* 114*  BUN 12 10 12   CREATININE 0.85 0.95 0.92  CALCIUM 9.6 9.3 8.9   GFR: Estimated Creatinine Clearance: 133.9 mL/min (by C-G formula based on SCr of 0.92 mg/dL). Liver Function Tests:  Recent Labs Lab 10/17/15 1813  AST 30  ALT 35  ALKPHOS 64  BILITOT 0.8  PROT 7.0  ALBUMIN 4.4   No results for input(s): LIPASE, AMYLASE in the last 168 hours. No results for input(s): AMMONIA in the last 168 hours. Coagulation Profile: No results for input(s): INR, PROTIME in the last 168 hours. Cardiac Enzymes: No results for input(s): CKTOTAL, CKMB, CKMBINDEX, TROPONINI in the last 168 hours. BNP (last 3 results) No results for input(s): PROBNP in the last 8760 hours. HbA1C: No results for input(s): HGBA1C in the last 72 hours. CBG: No results for input(s): GLUCAP in the last 168 hours. Lipid Profile: No results for input(s): CHOL, HDL, LDLCALC, TRIG, CHOLHDL, LDLDIRECT in the last 72 hours. Thyroid Function Tests: No results for input(s): TSH, T4TOTAL, FREET4, T3FREE, THYROIDAB in the last 72 hours. Anemia Panel: No results for input(s): VITAMINB12, FOLATE, FERRITIN, TIBC, IRON, RETICCTPCT in the last 72 hours. Urine analysis:    Component Value Date/Time   COLORURINE YELLOW 10/17/2015 1837   APPEARANCEUR CLEAR 10/17/2015 1837   LABSPEC 1.025 10/17/2015 1837   PHURINE 8.0 10/17/2015 1837   GLUCOSEU NEGATIVE 10/17/2015 1837   HGBUR NEGATIVE 10/17/2015 1837   BILIRUBINUR NEGATIVE 10/17/2015 1837   KETONESUR NEGATIVE 10/17/2015 1837   PROTEINUR NEGATIVE 10/17/2015 1837   NITRITE NEGATIVE 10/17/2015 1837   LEUKOCYTESUR NEGATIVE 10/17/2015 1837   Sepsis Labs: @LABRCNTIP (procalcitonin:4,lacticidven:4)  ) Recent Results (from the past 240 hour(s))  CSF culture     Status: None (Preliminary result)   Collection Time: 10/17/15  9:30 PM  Result Value Ref Range Status   Specimen Description CSF  Final   Special Requests NONE  Final   Gram Stain   Final     CYTOSPIN SMEAR WBC PRESENT,BOTH PMN AND MONONUCLEAR NO ORGANISMS SEEN    Culture NO GROWTH 2 DAYS  Final   Report Status PENDING  Incomplete     Invalid input(s): PROCALCITONIN, LACTICACIDVEN   Radiology Studies: Ct Head Wo Contrast  Result Date: 10/17/2015 CLINICAL DATA:  30 y/o M; headache with chills, nausea, and vomiting. EXAM: CT HEAD WITHOUT CONTRAST TECHNIQUE: Contiguous axial images were obtained from the base of the skull through the vertex without intravenous contrast. COMPARISON:  None. FINDINGS: Brain: No evidence of acute infarction, hemorrhage, hydrocephalus, extra-axial collection or mass lesion/mass effect. Vascular: No hyperdense vessel or unexpected calcification. Skull: Normal. Negative for fracture or focal lesion. Sinuses/Orbits: No acute finding. Other: None. IMPRESSION: No acute intracranial abnormality is identified. Unremarkable CT of head for age. Electronically Signed   By: Micah Noel  Furusawa-Stratton M.D.   On: 10/17/2015 20:17        Scheduled Meds: . enoxaparin (LOVENOX) injection  40 mg Subcutaneous Q24H  . meropenem (MERREM) IV  2 g Intravenous Q8H  . vancomycin  1,000 mg Intravenous Q8H   Continuous Infusions:    LOS: 1 day    Time spent: 35 minutes    Noma Quijas A, MD [facility] Pager [phone]  If 7PM-7AM, please contact night-coverage [email or web address] Password [email or web address] 10/19/2015, 1:20 PM

## 2015-10-20 LAB — C-REACTIVE PROTEIN: CRP: 1.1 mg/dL — AB (ref <1.0)

## 2015-10-20 LAB — SEDIMENTATION RATE: SED RATE: 2 mm/h (ref 0–16)

## 2015-10-20 MED ORDER — OXYCODONE-ACETAMINOPHEN 5-325 MG PO TABS: 1.0000 | ORAL_TABLET | ORAL | 0 refills | Status: AC | PRN

## 2015-10-20 NOTE — Progress Notes (Signed)
PROGRESS NOTE  Jeffery Ferguson  ZOX:[id number] DOB: 10/12/1985 DOA: 10/17/2015 PCP: No primary care provider on file. Outpatient Specialists:  Subjective: Seen with his father and mother at bedside initially reported headache improving and almost gone. Later reported headache with getting up and walk around which is different in quality.  Brief Narrative:  Jeffery Ferguson is a 30 y.o. male with no significant past medical history who presents with headache for 1 day.  History is collected mostly from the patient's father, at the bedside, as the patient is sleepy from diphenhydramine. Father reports that the patient [place] highways [place], and has had care over the last month from several different urgent cares and emergency rooms. About 2 weeks ago, he was seen somewhere for sore throat, diagnosed with strep, and prescribed amoxicillin. A few days later, he was seen in the ER for severe chest pain, had "CT scans" at other workup that was all negative, and was diagnosed with pleurisy, which the father was given a believe was from the strep throat.  Since then, the patient has completed amoxicillin, and been otherwise well until this morning when he woke up with an posterior headache. This progressed over the course of the day until it was severe in intensity, became associated with nausea and vomiting,  chills at times, photophobia, one episode of syncope per patient's mother, and dizziness, and so he came to the hospital.  Assessment & Plan:   Principal Problem:   Meningitis   Possible meningitis -Patient presented with headaches, does not have nuchal rigidity now but he reported some on admission. -Has facial pain suggesting also sinusitis especially frontal. -Temperature of 100.4, afebrile today, continue current antibiotics. -CSF showed 24 WBCs which is 84-89% neutrophils, protein is 54 and glucose is 59. -CSF Gram stain without any organism. -CSF studies looks  like it could be secondary to viral meningitis, ID to evaluate.  Headache -The initial headache which is frontal improved this morning. -After saw him he reported pounding headache with ambulation. -The pounding headache that came in with ambulation likely post LP headache, no evidence of CSF leak.  DVT prophylaxis:  Code Status: Full Code Family Communication:  Disposition Plan:  Diet: Diet regular Room service appropriate? Yes; Fluid consistency: Thin  Consultants:   ID  Procedures:   LP  Antimicrobials:   vancomycin and meropenem.   Objective: Vitals:   10/19/15 1130 10/19/15 1757 10/19/15 2109 10/20/15 0456  BP: 134/72 126/68 135/75 (!) 102/54  Pulse: 94 88 73 62  Resp: 20 20 19 19   Temp: 98.4 F (36.9 C) 98.2 F (36.8 C) 98.5 F (36.9 C) 97.7 F (36.5 C)  TempSrc: Oral Oral Oral Oral  SpO2: 98% 98% 99% 98%  Weight:   95.5 kg (210 lb 8.6 oz)   Height:        Intake/Output Summary (Last 24 hours) at 10/20/15 1156 Last data filed at 10/20/15 1000  Gross per 24 hour  Intake              920 ml  Output                0 ml  Net              920 ml   Filed Weights   10/18/15 0146 10/18/15 2223 10/19/15 2109  Weight: 93.9 kg (207 lb) 95.3 kg (210 lb 1.6 oz) 95.5 kg (210 lb 8.6 oz)    Examination: General exam: Appears calm  and comfortable  Respiratory system: Clear to auscultation. Respiratory effort normal. Cardiovascular system: S1 & S2 heard, RRR. No JVD, murmurs, rubs, gallops or clicks. No pedal edema. Gastrointestinal system: Abdomen is nondistended, soft and nontender. No organomegaly or masses felt. Normal bowel sounds heard. Central nervous system: Alert and oriented. No focal neurological deficits. Extremities: Symmetric 5 x 5 power. Skin: No rashes, lesions or ulcers Psychiatry: Judgement and insight appear normal. Mood & affect appropriate.   Data Reviewed: I have personally reviewed following labs and imaging studies  CBC:  Recent  Labs Lab 10/17/15 1813 10/18/15 0357 10/19/15 0540  WBC 9.5 8.5 7.2  NEUTROABS 7.0  --   --   HGB 15.7 15.7 14.0  HCT 46.5 46.0 41.8  MCV 89.6 90.6 90.1  PLT 231 229 198   Basic Metabolic Panel:  Recent Labs Lab 10/17/15 1813 10/18/15 0357 10/19/15 0540  NA 139 138 140  K 4.1 4.4 3.8  CL 105 104 105  CO2 24 25 26   GLUCOSE 104* 142* 114*  BUN 12 10 12   CREATININE 0.85 0.95 0.92  CALCIUM 9.6 9.3 8.9   GFR: Estimated Creatinine Clearance: 133.9 mL/min (by C-G formula based on SCr of 0.92 mg/dL). Liver Function Tests:  Recent Labs Lab 10/17/15 1813  AST 30  ALT 35  ALKPHOS 64  BILITOT 0.8  PROT 7.0  ALBUMIN 4.4   No results for input(s): LIPASE, AMYLASE in the last 168 hours. No results for input(s): AMMONIA in the last 168 hours. Coagulation Profile: No results for input(s): INR, PROTIME in the last 168 hours. Cardiac Enzymes: No results for input(s): CKTOTAL, CKMB, CKMBINDEX, TROPONINI in the last 168 hours. BNP (last 3 results) No results for input(s): PROBNP in the last 8760 hours. HbA1C: No results for input(s): HGBA1C in the last 72 hours. CBG: No results for input(s): GLUCAP in the last 168 hours. Lipid Profile: No results for input(s): CHOL, HDL, LDLCALC, TRIG, CHOLHDL, LDLDIRECT in the last 72 hours. Thyroid Function Tests: No results for input(s): TSH, T4TOTAL, FREET4, T3FREE, THYROIDAB in the last 72 hours. Anemia Panel: No results for input(s): VITAMINB12, FOLATE, FERRITIN, TIBC, IRON, RETICCTPCT in the last 72 hours. Urine analysis:    Component Value Date/Time   COLORURINE YELLOW 10/17/2015 1837   APPEARANCEUR CLEAR 10/17/2015 1837   LABSPEC 1.025 10/17/2015 1837   PHURINE 8.0 10/17/2015 1837   GLUCOSEU NEGATIVE 10/17/2015 1837   HGBUR NEGATIVE 10/17/2015 1837   BILIRUBINUR NEGATIVE 10/17/2015 1837   KETONESUR NEGATIVE 10/17/2015 1837   PROTEINUR NEGATIVE 10/17/2015 1837   NITRITE NEGATIVE 10/17/2015 1837   LEUKOCYTESUR NEGATIVE  10/17/2015 1837   Sepsis Labs: @LABRCNTIP (procalcitonin:4,lacticidven:4)  ) Recent Results (from the past 240 hour(s))  CSF culture     Status: None (Preliminary result)   Collection Time: 10/17/15  9:30 PM  Result Value Ref Range Status   Specimen Description CSF  Final   Special Requests NONE  Final   Gram Stain   Final    CYTOSPIN SMEAR WBC PRESENT,BOTH PMN AND MONONUCLEAR NO ORGANISMS SEEN    Culture NO GROWTH 3 DAYS  Final   Report Status PENDING  Incomplete     Invalid input(s): PROCALCITONIN, LACTICACIDVEN   Radiology Studies: No results found.      Scheduled Meds: . enoxaparin (LOVENOX) injection  40 mg Subcutaneous Q24H  . meropenem (MERREM) IV  2 g Intravenous Q8H  . vancomycin  1,000 mg Intravenous Q8H   Continuous Infusions:    LOS: 2 days  Time spent: 35 minutes    Asuzena Weis A, MD [facility] Pager [phone]  If 7PM-7AM, please contact night-coverage [email or web address] Password [facility] 10/20/2015, 11:56 AM

## 2015-10-20 NOTE — Consult Note (Signed)
[name]  Date of Admission:  10/17/2015  Date of Consult:  10/20/2015  Reason for Consult: Meningitis Referring Physician: [place] Poli  Impression/Recommendation Meningitis  Would  Continue anbx and isolation til his Cx is negative, final  Check State arbovirus panel, enterovirus PCR Question if there is an underlying Inflammatory syndrome due to his prolonged sx ASO Lyme ANA, ANCA, ESR, CRP Due to his persistent headaches would consider Neuro eval Consider MRI/MRA  Comment- Difficult case in that he received anbx prior to his tap and theoretically this could be partially treated meningitis. This would seem less likely as his other numbers (prot and glc) are near normal.  NSAIDs can also cause a Cx negative meningitis picture.  Lyme is not endemic to this area and I typically do not test for this. Given his diffuse symptoms, and multiple insect bites, will check.   Thank you so much for this truly interesting consult,   Bobby Rumpf (pager) [phone] [email or web address]  Jeffery Ferguson is an 30 y.o. male.  HPI: 30 yo M with hx of worsening back pain and DDD, comes to ED on 9-26 with 1 month hx of multiple ED visits. He was seen ~ 2 weeks PTA with sore throat, he was dx with "strep" and given amoxil. His children and wife were also dx with strep.  He then returned to ED for CP and had reportedly negative CT scans. He was dx with pleurisy.  He has been taking multiple doses of NSAIDS.  He woke up on 9-26 with posterior headache, n/v, chills, photophobia, chills, and an episode of syncope.  He was afebrile in ED, normal WBC, normal CT head, and LP showed 24 WBC (89% N), 1 RBC, Glc 59, protein 54. He was started on vanco/merrem.  Tmax 100.4.   CSF- HSV (-) VDRL (-) Cx negative  HIV RNA (-)   Past Medical History:  Diagnosis Date  . DVT (deep venous thrombosis) ([place])     Past Surgical History:  Procedure Laterality Date  .  APPENDECTOMY  2009     Allergies  Allergen Reactions  . Penicillins Nausea And Vomiting    Has patient had a PCN reaction causing immediate rash, facial/tongue/throat swelling, SOB or lightheadedness with hypotension: Yes Has patient had a PCN reaction causing severe rash involving mucus membranes or skin necrosis: No Has patient had a PCN reaction that required hospitalization No Has patient had a PCN reaction occurring within the last 10 years: No If all of the above answers are "NO", then may proceed with Cephalosporin use.   . Cephalexin Hives    Medications:  Scheduled: . enoxaparin (LOVENOX) injection  40 mg Subcutaneous Q24H  . meropenem (MERREM) IV  2 g Intravenous Q8H  . vancomycin  1,000 mg Intravenous Q8H    Abtx:  Anti-infectives    Start     Dose/Rate Route Frequency Ordered Stop   10/18/15 0800  vancomycin (VANCOCIN) IVPB 1000 mg/200 mL premix     1,000 mg 200 mL/hr over 60 Minutes Intravenous Every 8 hours 10/18/15 0117     10/18/15 0400  meropenem (MERREM) 2 g in sodium chloride 0.9 % 100 mL IVPB     2 g 200 mL/hr over 30 Minutes Intravenous Every 8 hours 10/18/15 0117     10/17/15 2345  meropenem (MERREM) 2 g in sodium chloride 0.9 % 100 mL IVPB     2 g 200 mL/hr over 30 Minutes Intravenous  Once 10/17/15 2334 10/18/15 0132  10/17/15 2330  meropenem (MERREM) 1 g in sodium chloride 0.9 % 100 mL IVPB  Status:  Discontinued     1 g 200 mL/hr over 30 Minutes Intravenous  Once 10/17/15 2326 10/17/15 2334   10/17/15 2315  vancomycin (VANCOCIN) 1,500 mg in sodium chloride 0.9 % 500 mL IVPB     1,500 mg 250 mL/hr over 120 Minutes Intravenous  Once 10/17/15 2254 10/18/15 0131      Total days of antibiotics: 2 vanco/merrem          Social History:  reports that he has never smoked. His smokeless tobacco use includes Chew. He reports that he does not drink alcohol or use drugs.  Family History  Problem Relation Age of Onset  . Cancer Other   . Heart disease  Other     General ROS: no rashes, multiple bug and tick bites. see HPI.   Blood pressure (!) 102/54, pulse 62, temperature 97.7 F (36.5 C), temperature source Oral, resp. rate 19, height _0  (1.854 m), weight 95.5 kg (210 lb 8.6 oz), SpO2 98 %. General appearance: alert, cooperative and no distress Head: Normocephalic, without obvious abnormality, atraumatic Eyes: negative findings: conjunctivae and sclerae normal and pupils equal, round, reactive to light and accomodation Throat: lips, mucosa, and tongue normal; teeth and gums normal Neck: no adenopathy, supple, symmetrical, trachea midline, thyroid not enlarged, symmetric, no tenderness/mass/nodules and FROM, no resistance.  Lungs: clear to auscultation bilaterally Heart: regular rate and rhythm Abdomen: normal findings: bowel sounds normal and soft, non-tender Extremities: edema none Skin: no rashes. no joint swelling.  Lymph nodes: Cervical, supraclavicular, and axillary nodes normal.   Results for orders placed or performed during the hospital encounter of 10/17/15 (from the past 48 hour(s))  CBC     Status: None   Collection Time: 10/19/15  5:40 AM  Result Value Ref Range   WBC 7.2 4.0 - 10.5 K/uL   RBC 4.64 4.22 - 5.81 MIL/uL   Hemoglobin 14.0 13.0 - 17.0 g/dL   HCT 41.8 39.0 - 52.0 %   MCV 90.1 78.0 - 100.0 fL   MCH 30.2 26.0 - 34.0 pg   MCHC 33.5 30.0 - 36.0 g/dL   RDW 13.1 11.5 - 15.5 %   Platelets 198 150 - 400 K/uL  Basic metabolic panel     Status: Abnormal   Collection Time: 10/19/15  5:40 AM  Result Value Ref Range   Sodium 140 135 - 145 mmol/L   Potassium 3.8 3.5 - 5.1 mmol/L   Chloride 105 101 - 111 mmol/L   CO2 26 22 - 32 mmol/L   Glucose, Bld 114 (H) 65 - 99 mg/dL   BUN 12 6 - 20 mg/dL   Creatinine, Ser 0.92 0.61 - 1.24 mg/dL   Calcium 8.9 8.9 - 10.3 mg/dL   GFR calc non Af Amer >60 >60 mL/min   GFR calc Af Amer >60 >60 mL/min    Comment: (NOTE) The eGFR has been calculated using the CKD EPI  equation. This calculation has not been validated in all clinical situations. eGFR's persistently <60 mL/min signify possible Chronic Kidney Disease.    Anion gap 9 5 - 15      Component Value Date/Time   SDES CSF 10/17/2015 2130   SPECREQUEST NONE 10/17/2015 2130   CULT NO GROWTH 3 DAYS 10/17/2015 2130   REPTSTATUS PENDING 10/17/2015 2130   No results found. Recent Results (from the past 240 hour(s))  CSF culture  Status: None (Preliminary result)   Collection Time: 10/17/15  9:30 PM  Result Value Ref Range Status   Specimen Description CSF  Final   Special Requests NONE  Final   Gram Stain   Final    CYTOSPIN SMEAR WBC PRESENT,BOTH PMN AND MONONUCLEAR NO ORGANISMS SEEN    Culture NO GROWTH 3 DAYS  Final   Report Status PENDING  Incomplete      10/20/2015, 3:48 PM     LOS: 2 days    Records and images were personally reviewed where available.

## 2015-10-21 ENCOUNTER — Inpatient Hospital Stay (HOSPITAL_COMMUNITY): Payer: Commercial Managed Care - PPO

## 2015-10-21 DIAGNOSIS — G4489 Other headache syndrome: Secondary | ICD-10-CM

## 2015-10-21 DIAGNOSIS — G6289 Other specified polyneuropathies: Secondary | ICD-10-CM

## 2015-10-21 LAB — CBC
HEMATOCRIT: 47.2 % (ref 39.0–52.0)
HEMOGLOBIN: 15.9 g/dL (ref 13.0–17.0)
MCH: 30.6 pg (ref 26.0–34.0)
MCHC: 33.7 g/dL (ref 30.0–36.0)
MCV: 90.9 fL (ref 78.0–100.0)
PLATELETS: 214 10*3/uL (ref 150–400)
RBC: 5.19 MIL/uL (ref 4.22–5.81)
RDW: 12.8 % (ref 11.5–15.5)
WBC: 6.2 10*3/uL (ref 4.0–10.5)

## 2015-10-21 LAB — VANCOMYCIN, TROUGH: VANCOMYCIN TR: 16 ug/mL (ref 15–20)

## 2015-10-21 LAB — CSF CULTURE: Culture: NO GROWTH

## 2015-10-21 LAB — B. BURGDORFI ANTIBODIES

## 2015-10-21 LAB — BASIC METABOLIC PANEL
ANION GAP: 9 (ref 5–15)
BUN: 9 mg/dL (ref 6–20)
CHLORIDE: 105 mmol/L (ref 101–111)
CO2: 26 mmol/L (ref 22–32)
CREATININE: 0.93 mg/dL (ref 0.61–1.24)
Calcium: 9 mg/dL (ref 8.9–10.3)
GFR calc non Af Amer: >60 mL/min (ref >60)
Glucose, Bld: 102 mg/dL — ABNORMAL HIGH (ref 65–99)
POTASSIUM: 4 mmol/L (ref 3.5–5.1)
Sodium: 140 mmol/L (ref 135–145)

## 2015-10-21 LAB — ANCA TITERS
C-ANCA: <1:20 {titer}
P-ANCA: <1:20 {titer}

## 2015-10-21 LAB — VITAMIN B12: Vitamin B-12: 512 pg/mL (ref 180–914)

## 2015-10-21 LAB — ANTISTREPTOLYSIN O TITER: ASO: 57 IU/mL (ref 0.0–200.0)

## 2015-10-21 LAB — CSF CULTURE W GRAM STAIN

## 2015-10-21 LAB — ANTINUCLEAR ANTIBODIES, IFA: ANA Ab, IFA: NEGATIVE

## 2015-10-21 MED ORDER — GADOBENATE DIMEGLUMINE 529 MG/ML IV SOLN
20.0000 mL | Freq: Once | INTRAVENOUS | Status: AC | PRN
  Administered 2015-10-21: 20 mL via INTRAVENOUS

## 2015-10-21 NOTE — Consult Note (Addendum)
. Neurology Consult Note  Reason for Consultation: persistent headache, meningitis  Requesting provider: Verlee Monte, MD  CC: Headache  HPI: This is a 30 year old right-handed man who initially presented to the [facility] emergency department on 10/18/15 with headache. History is obtained directly from the patient was a good historian. Family at the bedside office additional information as needed.  The patient reports that things seem to started recently when his family was treated for  streptococcal infections. Initially, his young son was treated for scarlet fever after which his infant child developed strep throat. Later, both he and his wife developed fever, sore throat, and chest pain. His wife tested positive for strep and was placed on antibiotics. He reports that he was never actually tested but was treated empirically with amoxicillin on 10/09/15. He completed a five-day course of antibiotics. A few days later, he developed severe chest pain and presented to the emergency department. He reports that testing was unrevealing and he was given a diagnosis of pleurisy. He was placed on anti-inflammatories and symptoms subsequently resolved.   He was then well until the morning of 10/17/15. He states that he sets his alarm to get up at 430 to prepare for work every morning. However, on that morning, he woke up before his alarm with an excruciating headache. This was accompanied by photophobia and nausea. He got ready for work and was riding with a coworker when his headache intensified and nausea increased. He had his coworker pull over and vomited a small amount of the side of the road. He then had his coworker drive him back home but on the way continued vomiting. When he got home, he went back to bed and states that his headache eventually began to feel a little bit better. He had a meeting that he had to attend later that afternoon and reports that the headache increased once again. He went to an  urgent care and  his mother-in-law met him there. She states that he appeared to be in severe pain and states that he was nearly "seizing" because of the pain. At urgent care, he was given a shot of Toradol and told to present to the emergency department. However, he did want to go to the ER so instead decided go to his family [facility] office. His mother-in-law drove him She drove him to his family 58 office and states that on the way, he appeared to pass out. He was then taken to the emergency department for further evaluation.  In the emergency department, he was noted to be uncomfortable complaining of headache with photophobia. No focal neurologic deficits were appreciated. He did not have any nuchal rigidity but complained of subjective pain in the occipital region that increased with neck and shoulder movement. His temperature was 99.7 degrees. Due to concern about possible meningitis, lumbar puncture was performed and showed a neutrophilic pleocytosis with 24 white blood cells. Protein was mildly elevated at 54. No opening pressure was documented. He was placed empirically on antibiotics to cover back to meningitis and admitted to the hospital for further evaluation.  He states that after admission, his initial headache seemed to improve. However, on 10/18/15, he developed a different headache. This one he describes as extremely positional. As long as he lays flat he has no problem. However, after he sits up or stands for more than a few minutes, he develops severe headache that is largely bifrontal but that extends over the vertex. This is associated with nausea. The longer  he is upright, the more severe the headache gets. If he lays down the headache will improve once again. He states he has been drinking lots of caffeine today but has not appreciated any change in his headache. He has never had any neurological deficits at any point along this presentation. He reports that he has had  numerous insect bites, including ticks[place].  Infectious disease was consulted on 10/20/15 and felt that his meningitis was likely noninfectious in etiology, though they also felt that a partially treated bacterial meningitis could not be excluded. He was placed on vancomycin and meropenem. Additional studies were ordered by ID, including CSF arbovirus panel, CSF enterovirus PCR, antistreptolysin O titer, Lyme titers, ANA, ANCA, ESR, and CRP. Workup has been largely unrevealing. Today, ID stopped the vancomycin and recommended a continued course of meropenem for a total of 7 days. He also sent anti-NMDA receptor antibodies.  PMH:  1. Chronic lumbar disc disease with radiculopathy which has been treated with steroid injections 2. History of DVT left lower extremity; he points behind his knee suggesting likely popliteal vessels   PSH:  Past Surgical History:  Procedure Laterality Date  . APPENDECTOMY  2009    Family history: Family History  Problem Relation Age of Onset  . Cancer Other   . Heart disease Other     Social history:  He is married and lives with his wife and 2 young children. He works for the [employer]. He denies cigarette smoking but does use chewing tobacco. He denies use of alcohol or illicit drugs.   Current outpatient meds: Current Meds  Medication Sig  . metaxalone (SKELAXIN) 800 MG tablet Take 800 mg by mouth at bedtime as needed for muscle spasms.   Marland Kitchen omeprazole (PRILOSEC) 20 MG capsule Take 20 mg by mouth 2 (two) times daily.  . traMADol (ULTRAM) 50 MG tablet Take 100 mg by mouth 3 (three) times daily as needed for moderate pain.     Current inpatient meds:  Current Facility-Administered Medications  Medication Dose Route Frequency Provider Last Rate Last Dose  . acetaminophen (TYLENOL) tablet 650 mg  650 mg Oral Q6H PRN Edwin Dada, MD   650 mg at 10/18/15 2229   Or   . acetaminophen (TYLENOL) suppository 650 mg  650 mg Rectal Q6H PRN Edwin Dada, MD      . diphenhydrAMINE (BENADRYL) capsule 25 mg  25 mg Oral QHS PRN Gardiner Barefoot, NP   25 mg at 10/19/15 0052  . enoxaparin (LOVENOX) injection 40 mg  40 mg Subcutaneous Q24H Edwin Dada, MD   40 mg at 10/21/15 0850  . meropenem (MERREM) 2 g in sodium chloride 0.9 % 100 mL IVPB  2 g Intravenous Q8H Veronda P Bryk, RPH   2 g at 10/21/15 1735  . ondansetron (ZOFRAN) tablet 4 mg  4 mg Oral Q6H PRN Edwin Dada, MD       Or  . ondansetron (ZOFRAN) injection 4 mg  4 mg Intravenous Q6H PRN Edwin Dada, MD      . oxyCODONE-acetaminophen (PERCOCET/ROXICET) 5-325 MG per tablet 1-2 tablet  1-2 tablet Oral Q4H PRN Verlee Monte, MD   1 tablet at 10/21/15 1026  . senna-docusate (Senokot-S) tablet 1 tablet  1 tablet Oral QHS PRN Edwin Dada, MD   1 tablet at 10/20/15 1039    Allergies: Allergies  Allergen Reactions  .  Penicillins Nausea And Vomiting    Has patient had a PCN reaction causing immediate rash, facial/tongue/throat swelling, SOB or lightheadedness with hypotension: Yes Has patient had a PCN reaction causing severe rash involving mucus membranes or skin necrosis: No Has patient had a PCN reaction that required hospitalization No Has patient had a PCN reaction occurring within the last 10 years: No If all of the above answers are "NO", then may proceed with Cephalosporin use.   . Cephalexin Hives    ROS: As per HPI. A full 14-point review of systems was performed and is otherwise unremarkable.   PE:  BP 111/68 (BP Location: Left Arm)   Pulse 71   Temp 97.6 F (36.4 C) (Oral)   Resp 18   Ht '6\' 1"'$  (1.854 m)   Wt 95.5 kg (210 lb 8.6 oz)   SpO2 100%   BMI 27.78 kg/m   General: WDWN, no acute distress. AAO x4. Speech clear, no dysarthria. He has an occasional stutter.No aphasia. Follows commands briskly. Affect is bright with congruent mood.  Comportment is normal.  HEENT: Normocephalic. Neck supple without LAD. MMM, OP clear. Dentition good. Sclerae anicteric. No conjunctival injection.  CV: Regular, no murmur. Carotid pulses full and symmetric, no bruits. Distal pulses 2+ and symmetric.  Lungs: CTAB.  Abdomen: Soft, non-distended, non-tender. Bowel sounds present x4.  Extremities: No C/C/E. Neuro:  CN: Pupils are equal and round. They are symmetrically reactive from 3-->2 mm. EOMI without nystagmus. No reported diplopia. Facial sensation is intact to light touch. Face is symmetric at rest with normal strength and mobility. Hearing is intact to conversational voice. Palate elevates symmetrically and uvula is midline. Voice is normal in tone, pitch and quality. Bilateral SCM and trapezii are 5/5. Tongue is midline with normal bulk and mobility.  Motor: Normal bulk, tone, and strength. No tremor or other abnormal movements. No drift.  Sensation: Intact to light touch and pinprick. Vibration and joint position are mildly diminished in both feet.Marland Kitchen  DTRs: 2+, symmetric. Toes downgoing bilaterally. No pathologic reflexes.  Coordination: Finger-to-nose and heel-to-shin are without dysmetria. Finger taps are normal in amplitude and speed, no decrement.    Labs:  Lab Results  Component Value Date   WBC 6.2 10/21/2015   HGB 15.9 10/21/2015   HCT 47.2 10/21/2015   PLT 214 10/21/2015   GLUCOSE 102 (H) 10/21/2015   ALT 35 10/17/2015   AST 30 10/17/2015   NA 140 10/21/2015   K 4.0 10/21/2015   CL 105 10/21/2015   CREATININE 0.93 10/21/2015   BUN 9 10/21/2015   CO2 26 10/21/2015   CSF white blood cell count 24, 89% neutrophils CSF red blood cell count 1 CSF protein 54  CSF glucose 59 (serum 104) CSF culture negative at 3 days CSF VDRL nonreactive CSF HSV PCR negative CSF cryptococcal antigen negative HIV antibodies nonreactive HIV-1 RNA Quant less than 20 CRP 1.1 ESR 2 ANA negative C-ANCA negative P-ANCA negative Atypical  p-ANCA negative Serum Lyme titers negative Antistreptolysin O negative  Imaging: I have personally and independently reviewed CT scan of the head without contrast from 10/17/15. This appears normal.  Assessment and Plan:  1. Meningitis: The etiology remains unclear. Profile with mild neutrophilic pleocytosis with mild protein elevation and normal glucose is suggestive of possible early viral meningitis versus partially treated bacterial meningitis. West Nile virus can have a neutrophilic pleocytosis and remains near the top of the differential given reports of multiple mosquito bites. Other arboviruses can also give neutrophilic  pleocytosis, particularly early in the  infections. Arbovirus panel is pending. ID has recommended a total of 7 days of meropenem which is reasonable given concern for partially treated bacterial process. I would agree that other inflammatory causes must also be considered. His presentation is not consistent with typical autoimmune encephalitis caused by anti-NMDA receptor antibodies; these are pending but will take some time to return. Screening autoimmune labs thus far have been negative. Postinfectious CNS inflammation is possible, particularly after strep infection. Rickettsial illnesses must be considered, though these typically resulted in a mononuclear pleocytosis or normal CSF. I will order MRI of the brain with and without contrast and MRA of the head without contrast to further evaluate.  2. Headache: Initial headache was consistent with meningitis. However, he now has a strongly positional headache that intensifies anytime he has his head in upright position for more than a few minutes. This is consistent with a low-pressure headache, likely due to his lumbar puncture. This has persisted for the past 3 days, and he did not appreciate any improvement with increased caffeine intake today. I would recommend consideration for a blood patch by anesthesiology. Continue to push  fluids.  3. Peripheral neuropathy: His examination demonstrates a very mild symmetric loss of vibration and joint position sense in both feet. 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 I will check vitamin B12 levels. HIV is negative. He has no evidence of thyroid disease so I am deferring thyroid labs. This is very minimal at this time and asymptomatic. This can be observed.  This was discussed at length with the patient, his wife, and his other family members at the bedside. They are in agreement with the plan as noted. There were given the opportunity to ask any questions and these were addressed to their satisfaction.  Thank you for this consultation. Please call with any questions or concerns. I will continue to follow with you.

## 2015-10-21 NOTE — Progress Notes (Signed)
Pharmacy Antibiotic Note  Jeffery Ferguson is a 30 y.o. male admitted on 10/17/2015 with possible meningitis.  Pharmacy has been consulted for Vancocin and Merrem dosing.  Workup is underway and ID team is involved. Patient missed a dose of vancomycin on 9/28, so unable to get trough before today.  Plan: Vancomycin 1000mg  IV q8h.  Goal trough 15-20 mcg/mL.  Will obtain trough, BMET and CBC at 1530 this afternoon Merrem 2g IV q8h  Height: 6\' 1"  (185.4 cm) Weight: 210 lb 8.6 oz (95.5 kg) IBW/kg (Calculated) : 79.9  Temp (24hrs), Avg:98 F (36.7 C), Min:98 F (36.7 C), Max:98 F (36.7 C)   Recent Labs Lab 10/17/15 1813 10/17/15 1845 10/18/15 0357 10/19/15 0540  WBC 9.5  --  8.5 7.2  CREATININE 0.85  --  0.95 0.92  LATICACIDVEN  --  1.68  --   --     Estimated Creatinine Clearance: 133.9 mL/min (by C-G formula based on SCr of 0.92 mg/dL).    Allergies  Allergen Reactions  . Penicillins Nausea And Vomiting    Has patient had a PCN reaction causing immediate rash, facial/tongue/throat swelling, SOB or lightheadedness with hypotension: Yes Has patient had a PCN reaction causing severe rash involving mucus membranes or skin necrosis: No Has patient had a PCN reaction that required hospitalization No Has patient had a PCN reaction occurring within the last 10 years: No If all of the above answers are "NO", then may proceed with Cephalosporin use.   . Cephalexin Hives    Antimicrobials this admission:  Vanc 9/25>>  *missed dose 9/28 Merrem 9/26 >>  *missed dose on 9/26  Dose adjustments this admission:  N/a  Microbiology results:  9/25 CSF: neg  Thank you for allowing pharmacy to be a part of this patient's care.  Alura Olveda D. Jalin Alicea, PharmD, BCPS Clinical Pharmacist Pager: [phone] 10/21/2015 11:55 AM

## 2015-10-21 NOTE — Progress Notes (Signed)
   INFECTIOUS DISEASE PROGRESS NOTE  ID: Jeffery Ferguson is a 30 y.o. male with  Principal Problem:   Meningitis  Subjective: Continued headaches with sitting up.   Abtx:  Anti-infectives    Start     Dose/Rate Route Frequency Ordered Stop   10/18/15 0800  vancomycin (VANCOCIN) IVPB 1000 mg/200 mL premix     1,000 mg 200 mL/hr over 60 Minutes Intravenous Every 8 hours 10/18/15 0117     10/18/15 0400  meropenem (MERREM) 2 g in sodium chloride 0.9 % 100 mL IVPB     2 g 200 mL/hr over 30 Minutes Intravenous Every 8 hours 10/18/15 0117     10/17/15 2345  meropenem (MERREM) 2 g in sodium chloride 0.9 % 100 mL IVPB     2 g 200 mL/hr over 30 Minutes Intravenous  Once 10/17/15 2334 10/18/15 0132   10/17/15 2330  meropenem (MERREM) 1 g in sodium chloride 0.9 % 100 mL IVPB  Status:  Discontinued     1 g 200 mL/hr over 30 Minutes Intravenous  Once 10/17/15 2326 10/17/15 2334   10/17/15 2315  vancomycin (VANCOCIN) 1,500 mg in sodium chloride 0.9 % 500 mL IVPB     1,500 mg 250 mL/hr over 120 Minutes Intravenous  Once 10/17/15 2254 10/18/15 0131      Medications:  Scheduled: . enoxaparin (LOVENOX) injection  40 mg Subcutaneous Q24H  . meropenem (MERREM) IV  2 g Intravenous Q8H  . vancomycin  1,000 mg Intravenous Q8H    Objective: Vital signs in last 24 hours: Temp:  [98 F (36.7 C)] 98 F (36.7 C) (09/29 1002) Pulse Rate:  [63-90] 90 (09/29 1002) Resp:  [16-18] 18 (09/29 1002) BP: (104-135)/(50-81) 135/81 (09/29 1002) SpO2:  [98 %-100 %] 100 % (09/29 1002)   General appearance: alert, cooperative and no distress Resp: clear to auscultation bilaterally Cardio: regular rate and rhythm GI: normal findings: bowel sounds normal and soft, non-tender  Neck non-tender.   Lab Results  Recent Labs  10/19/15 0540  WBC 7.2  HGB 14.0  HCT 41.8  NA 140  K 3.8  CL 105  CO2 26  BUN 12  CREATININE 0.92   Liver Panel No results for input(s): PROT, ALBUMIN, AST, ALT, ALKPHOS,  BILITOT, BILIDIR, IBILI in the last 72 hours. Sedimentation Rate  Recent Labs  10/20/15 1828  ESRSEDRATE 2   C-Reactive Protein  Recent Labs  10/20/15 2245  CRP 1.1*    Microbiology: Recent Results (from the past 240 hour(s))  CSF culture     Status: None   Collection Time: 10/17/15  9:30 PM  Result Value Ref Range Status   Specimen Description CSF  Final   Special Requests NONE  Final   Gram Stain   Final    CYTOSPIN SMEAR WBC PRESENT,BOTH PMN AND MONONUCLEAR NO ORGANISMS SEEN    Culture NO GROWTH 3 DAYS  Final   Report Status 10/21/2015 FINAL  Final    Studies/Results: No results found.   Assessment/Plan: Meningitis Headaches  Day 3 merrem/vanco  Await neuro eval Check NMDA Consider MRI/MRA Await his enterovirus PCR Await his arbovirus panel (will take several weeks) Will stop vanco, aim for 7 days of merrem  Dr Daiva EvesVan Dam available if questions over weekend.          Johny SaxJeffrey Hatcher Infectious Diseases (pager) [phone] [email or web address] 10/21/2015, 3:40 PM  LOS: 3 days

## 2015-10-21 NOTE — Progress Notes (Signed)
PROGRESS NOTE  Shlome Rovner  SXJ:[id number] DOB: Sep 25, 1985 DOA: 10/17/2015 PCP: No primary care provider on file. Outpatient Specialists:  Subjective: Reported he is much better, denies any headache this morning. Same scenario as yesterday, the nurse reported to me before noon that he have very severe headache when he tried to sit up. Neurology to evaluate  Brief Narrative:  Dreyson Mishkin is a 30 y.o. male with no significant past medical history who presents with headache for 1 day.  History is collected mostly from the patient's father, at the bedside, as the patient is sleepy from diphenhydramine. Father reports that the patient [place] highways [place] bridges [place], and has had care over the last month from several different urgent cares and emergency rooms. About 2 weeks ago, he was seen somewhere for sore throat, diagnosed with strep, and prescribed amoxicillin. A few days later, he was seen in the ER for severe chest pain, had "CT scans" at other workup that was all negative, and was diagnosed with pleurisy, which the father was given a believe was from the strep throat.  Since then, the patient has completed amoxicillin, and been otherwise well until this morning when he woke up with an posterior headache. This progressed over the course of the day until it was severe in intensity, became associated with nausea and vomiting,  chills at times, photophobia, one episode of syncope per patient's mother, and dizziness, and so he came to the hospital.  Assessment & Plan:   Principal Problem:   Meningitis   Possible meningitis -Patient presented with headaches, does not have nuchal rigidity now but he reported some on admission. -Temperature of 100.4, afebrile today, continue current antibiotics. -CSF showed 24 WBCs which is 84-89% neutrophils, protein is 54 and glucose is 59. -CSF Gram stain without any organism. -CSF studies looks like it could be secondary to viral  meningitis or partial treated bacterial. -CSF culture is negative, final, isolation discontinued, can likely discontinue antibiotics await ID. -I appreciate ID help, ASO, ESR and CRP WNL, ANA, ANCA and Lyme pending.  Headache -The initial headache which is frontal improved this morning. -After saw him he reported pounding headache with ambulation. -Headache is positional, cannot rule out post LP headache, neurology to evaluate.  DVT prophylaxis:  Code Status: Full Code Family Communication:  Disposition Plan:  Diet: Diet regular Room service appropriate? Yes; Fluid consistency: Thin  Consultants:   ID  Procedures:   LP  Antimicrobials:   vancomycin and meropenem.   Objective: Vitals:   10/20/15 0456 10/20/15 2103 10/21/15 0445 10/21/15 1002  BP: (!) 102/54 131/63 (!) 104/50 135/81  Pulse: 62 75 63 90  Resp: _0 Temp: 97.7 F (36.5 C) 98 F (36.7 C) 98 F (36.7 C) 98 F (36.7 C)  TempSrc: Oral Oral Oral Oral  SpO2: 98% 98% 100% 100%  Weight:      Height:        Intake/Output Summary (Last 24 hours) at 10/21/15 1300 Last data filed at 10/21/15 0900  Gross per 24 hour  Intake             1780 ml  Output                1 ml  Net             1779 ml   Filed Weights   10/18/15 0146 10/18/15 2223 10/19/15 2109  Weight: 93.9 kg (207 lb) 95.3 kg (210 lb  1.6 oz) 95.5 kg (210 lb 8.6 oz)    Examination: General exam: Appears calm and comfortable  Respiratory system: Clear to auscultation. Respiratory effort normal. Cardiovascular system: S1 & S2 heard, RRR. No JVD, murmurs, rubs, gallops or clicks. No pedal edema. Gastrointestinal system: Abdomen is nondistended, soft and nontender. No organomegaly or masses felt. Normal bowel sounds heard. Central nervous system: Alert and oriented. No focal neurological deficits. Extremities: Symmetric 5 x 5 power. Skin: No rashes, lesions or ulcers Psychiatry: Judgement and insight appear normal. Mood & affect  appropriate.   Data Reviewed: I have personally reviewed following labs and imaging studies  CBC:  Recent Labs Lab 10/17/15 1813 10/18/15 0357 10/19/15 0540  WBC 9.5 8.5 7.2  NEUTROABS 7.0  --   --   HGB 15.7 15.7 14.0  HCT 46.5 46.0 41.8  MCV 89.6 90.6 90.1  PLT 231 229 341   Basic Metabolic Panel:  Recent Labs Lab 10/17/15 1813 10/18/15 0357 10/19/15 0540  NA 139 138 140  K 4.1 4.4 3.8  CL 105 104 105  CO2 _0 GLUCOSE 104* 142* 114*  BUN _1 CREATININE 0.85 0.95 0.92  CALCIUM 9.6 9.3 8.9   GFR: Estimated Creatinine Clearance: 133.9 mL/min (by C-G formula based on SCr of 0.92 mg/dL). Liver Function Tests:  Recent Labs Lab 10/17/15 1813  AST 30  ALT 35  ALKPHOS 64  BILITOT 0.8  PROT 7.0  ALBUMIN 4.4   No results for input(s): LIPASE, AMYLASE in the last 168 hours. No results for input(s): AMMONIA in the last 168 hours. Coagulation Profile: No results for input(s): INR, PROTIME in the last 168 hours. Cardiac Enzymes: No results for input(s): CKTOTAL, CKMB, CKMBINDEX, TROPONINI in the last 168 hours. BNP (last 3 results) No results for input(s): PROBNP in the last 8760 hours. HbA1C: No results for input(s): HGBA1C in the last 72 hours. CBG: No results for input(s): GLUCAP in the last 168 hours. Lipid Profile: No results for input(s): CHOL, HDL, LDLCALC, TRIG, CHOLHDL, LDLDIRECT in the last 72 hours. Thyroid Function Tests: No results for input(s): TSH, T4TOTAL, FREET4, T3FREE, THYROIDAB in the last 72 hours. Anemia Panel: No results for input(s): VITAMINB12, FOLATE, FERRITIN, TIBC, IRON, RETICCTPCT in the last 72 hours. Urine analysis:    Component Value Date/Time   COLORURINE YELLOW 10/17/2015 1837   APPEARANCEUR CLEAR 10/17/2015 1837   LABSPEC 1.025 10/17/2015 1837   PHURINE 8.0 10/17/2015 1837   GLUCOSEU NEGATIVE 10/17/2015 1837   HGBUR NEGATIVE 10/17/2015 1837   BILIRUBINUR NEGATIVE 10/17/2015 1837   KETONESUR NEGATIVE  10/17/2015 1837   PROTEINUR NEGATIVE 10/17/2015 1837   NITRITE NEGATIVE 10/17/2015 1837   LEUKOCYTESUR NEGATIVE 10/17/2015 1837   Sepsis Labs: _2 (procalcitonin:4,lacticidven:4)  ) Recent Results (from the past 240 hour(s))  CSF culture     Status: None   Collection Time: 10/17/15  9:30 PM  Result Value Ref Range Status   Specimen Description CSF  Final   Special Requests NONE  Final   Gram Stain   Final    CYTOSPIN SMEAR WBC PRESENT,BOTH PMN AND MONONUCLEAR NO ORGANISMS SEEN    Culture NO GROWTH 3 DAYS  Final   Report Status 10/21/2015 FINAL  Final     Invalid input(s): PROCALCITONIN, LACTICACIDVEN   Radiology Studies: No results found.      Scheduled Meds: . enoxaparin (LOVENOX) injection  40 mg Subcutaneous Q24H  . meropenem (MERREM) IV  2 g Intravenous Q8H  . vancomycin  1,000  mg Intravenous Q8H   Continuous Infusions:    LOS: 3 days    Time spent: 35 minutes    Markayla Reichart A, MD [facility] Pager [phone]  If 7PM-7AM, please contact night-coverage [email or web address] Password [email or web address] 10/21/2015, 1:00 PM

## 2015-10-22 DIAGNOSIS — G971 Other reaction to spinal and lumbar puncture: Secondary | ICD-10-CM

## 2015-10-22 DIAGNOSIS — G629 Polyneuropathy, unspecified: Secondary | ICD-10-CM

## 2015-10-22 LAB — MPO/PR-3 (ANCA) ANTIBODIES: ANCA Proteinase 3: <3.5 U/mL (ref 0.0–3.5)

## 2015-10-22 MED ORDER — POLYETHYLENE GLYCOL 3350 17 G PO PACK
17.0000 g | PACK | Freq: Every day | ORAL | Status: DC
Start: 2015-10-22 — End: 2015-10-24
  Administered 2015-10-22: 17 g via ORAL
  Filled 2015-10-22 (×3): qty 1

## 2015-10-22 MED ORDER — DOCUSATE SODIUM 100 MG PO CAPS
100.0000 mg | ORAL_CAPSULE | Freq: Two times a day (BID) | ORAL | Status: DC
  Administered 2015-10-22 – 2015-10-23 (×3): 100 mg via ORAL
  Filled 2015-10-22 (×5): qty 1

## 2015-10-22 NOTE — Progress Notes (Addendum)
Neurology Progress Note  Subjective: He still has a severe headache when upright. As long as he is laying flat, he is pain free. Any elevation of the head, even while lying in bed, results in some discomfort. This is most intense if he sits all the way up or stands, reaching its most intense after 8-10 minutes and then persisting as severe headache until he lays down again. This is associated with nausea. He has no focal neurologic symptoms. He reports some back pain located broadly across the low back, no radiation. This is somewhat different than his chronic back pain as it is more widespread across the lumbar region.   Current Meds:   Current Facility-Administered Medications:  .  acetaminophen (TYLENOL) tablet 650 mg, 650 mg, Oral, Q6H PRN, 650 mg at 10/18/15 2229 **OR** acetaminophen (TYLENOL) suppository 650 mg, 650 mg, Rectal, Q6H PRN, Alberteen Sam, MD .  diphenhydrAMINE (BENADRYL) capsule 25 mg, 25 mg, Oral, QHS PRN, Leda Gauze, NP, 25 mg at 10/19/15 0052 .  docusate sodium (COLACE) capsule 100 mg, 100 mg, Oral, BID, Clydia Llano, MD .  enoxaparin (LOVENOX) injection 40 mg, 40 mg, Subcutaneous, Q24H, Alberteen Sam, MD, 40 mg at 10/21/15 0850 .  meropenem (MERREM) 2 g in sodium chloride 0.9 % 100 mL IVPB, 2 g, Intravenous, Q8H, Veronda P Bryk, RPH, 2 g at 10/22/15 0850 .  ondansetron (ZOFRAN) tablet 4 mg, 4 mg, Oral, Q6H PRN **OR** ondansetron (ZOFRAN) injection 4 mg, 4 mg, Intravenous, Q6H PRN, Alberteen Sam, MD, 4 mg at 10/21/15 2039 .  oxyCODONE-acetaminophen (PERCOCET/ROXICET) 5-325 MG per tablet 1-2 tablet, 1-2 tablet, Oral, Q4H PRN, Clydia Llano, MD, 1 tablet at 10/21/15 2034 .  polyethylene glycol (MIRALAX / GLYCOLAX) packet 17 g, 17 g, Oral, Daily, Clydia Llano, MD .  senna-docusate (Senokot-S) tablet 1 tablet, 1 tablet, Oral, QHS PRN, Alberteen Sam, MD, 1 tablet at 10/20/15 1039  Objective:  Temp:  [97.5 F (36.4 C)-98 F (36.7 C)] 97.9  F (36.6 C) (09/30 0506) Pulse Rate:  [68-90] 68 (09/30 0506) Resp:  [16-18] 16 (09/30 0506) BP: (110-141)/(49-81) 110/49 (09/30 0506) SpO2:  [97 %-100 %] 97 % (09/30 0506)  General: WDWN resting in bed in NAD. Alert, oriented x4. Speech is clear without dysarthria. Affect is bright. Comportment is normal.  Neuro: MS: As noted above. No aphasia.  CN: Pupils are equal. EOMI, no nystagmus. Face is symmetric at rest with normal strength and mobility. Hearing is intact to conversational voice.  Motor: Grossly normal bulk, tone, and strength throughout. No tremor or other abnormal movements are observed.   Labs: Lab Results  Component Value Date   WBC 6.2 10/21/2015   HGB 15.9 10/21/2015   HCT 47.2 10/21/2015   PLT 214 10/21/2015   GLUCOSE 102 (H) 10/21/2015   ALT 35 10/17/2015   AST 30 10/17/2015   NA 140 10/21/2015   K 4.0 10/21/2015   CL 105 10/21/2015   CREATININE 0.93 10/21/2015   BUN 9 10/21/2015   CO2 26 10/21/2015   CBC Latest Ref Rng & Units 10/21/2015 10/19/2015 10/18/2015  WBC 4.0 - 10.5 K/uL 6.2 7.2 8.5  Hemoglobin 13.0 - 17.0 g/dL 16.1 09.6 04.5  Hematocrit 39.0 - 52.0 % 47.2 41.8 46.0  Platelets 150 - 400 K/uL 214 198 229    No results found for: HGBA1C Lab Results  Component Value Date   ALT 35 10/17/2015   AST 30 10/17/2015   ALKPHOS 64 10/17/2015   BILITOT  0.8 10/17/2015   B12 512  Pending studies:  CSF arbovirus panel CSF enterovirus PCR Anti-NMDA receptor Abs  Radiology:  I have personally and independently reviewed the MRI of the brain with and without contrast from 10/21/15. This shows a few small scattered areas of T2/FLAIR hyperintensity in the bihemispheric white matter. These are non-specific in appearance but may be related to his prior history of several concussions. Less likely consideration would be postinfectious demyelination. No abnormal enhancement.   I have personally and independently reviewed the MRA of the head from 10/21/15. This is  unremarkable.    A/P:   1. Meningitis: Etiology remains unclear. He was taking ibuprofen for his pleurisy and this is known to cause an aseptic meningitis with a neutrophilic predominance, making this a potential culprit. Given recent strep infection complicated by apparent pleurisy, may be related to poststreptococcal immune response. MRI brain is without evidence of ADEM or other structural pathology that could explain his presentation. Partially treated bacterial meningitis is possible but seems less likely given that he did not develop meningeal symptoms until several days after he had completed his course of antibiotic. Early viral meningitides can produce a neutrophilic pleocytosis in the CSF that subsequently becomes mononuclear, with West Nile Virus meningitis having persistent PMNs.   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 Treatment is supportive. ID has recommended that he complete a seven day course of meropenem, now on day #4. Ensure adequate fluid intake. Avoid NSAIDs for now, particularly ibuprofen. There is not thought to be cross-reactivity with other NSAIDs though there are case reports of some individuals developing meningitis with multiple NSAIDs.    2. Spinal headache: Currently his headache is typical of a spinal headache which has been present since his LP in the ED. He has not responded to fluids and caffeine. Recommend blood patch, unable to arrange. Spoke with anesthesia (Dr. Sampson Goon) who stated there is an agreement that IR does all blood patches. I called IR on-call Dr. Grace Isaac and was asked to call IR Dr. Bonnielee Haff; Dr. Bonnielee Haff apparently does blood patches but Dr. Grace Isaac does not. I spoke with Dr. Bonnielee Haff who stated that by policy they do not do blood patches on the weekend, only Monday-Friday, and that since his headache is better while lying flat then he can go home and lay flat. Unfortunately for [name], it appears no one is  willing to perform the procedure today. Since he will be here for antibiotics through Monday, would try again then if his headache persists. Avoid NSAIDs in treating headache and pain given suspicion for NSAID-induced meningitis.   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 B12 normal at 512. HIV, RPR nonreactive. No evidence of thyroid disease so will defer. He has had some supranormal glucoses this admission but this is in the setting of acute illness. This should be monitored as glucose intolerance can produce neuropathy well before frank DM is present and is a common cause of neuropathy.   This was discussed with the patient and his wife and they are in agreement with the plan as noted. They were given the opportunity to ask any questions and these were addressed to their satisfaction. They asked whether he could leave the floor for a few minutes today to visit with his children as they do not want to bring them into the hospital and risk exposure to anything. I think this  is a great idea and have discussed it with his RN who will help facilitate this when the patient is ready.   A total of 40 minutes was spent, much of it with coordination of care, attempting to arrange a blood patch with other providers.   Rhona Leavensimothy Oster, MD [facility]

## 2015-10-22 NOTE — Progress Notes (Signed)
PROGRESS NOTE  Jeffery Ferguson  XYD:[id number] DOB: Dec 29, 1985 DOA: 10/17/2015 PCP: No primary care provider on file. Outpatient Specialists:  Subjective: Seen with his father at bedside, continues to describe positional headache.  Per ID continue meropenem for total of 7 days, recommend consideration of blood patch continues to have headaches.  Brief Narrative:  Jeffery Ferguson is a 30 y.o. male with no significant past medical history who presents with headache for 1 day.  History is collected mostly from the patient's father, at the bedside, as the patient is sleepy from diphenhydramine. Father reports that the patient [place], and has had care over the last month from several different urgent cares and emergency rooms. About 2 weeks ago, he was seen somewhere for sore throat, diagnosed with strep, and prescribed amoxicillin. A few days later, he was seen in the ER for severe chest pain, had "CT scans" at other workup that was all negative, and was diagnosed with pleurisy, which the father was given a believe was from the strep throat.  Since then, the patient has completed amoxicillin, and been otherwise well until this morning when he woke up with an posterior headache. This progressed over the course of the day until it was severe in intensity, became associated with nausea and vomiting,  chills at times, photophobia, one episode of syncope per patient's mother, and dizziness, and so he came to the hospital.  Assessment & Plan:   Principal Problem:   Meningitis   Possible meningitis -Patient presented with headaches, does not have nuchal rigidity now but he reported some on admission. -Temperature of 100.4, afebrile today, continue current antibiotics. -CSF showed 24 WBCs which is 84-89% neutrophils, protein is 54 and glucose is 59. -CSF Gram stain without any organism. -CSF studies looks like it could be secondary to viral meningitis or  partially treated bacterial meningitis. -CSF cultures negative, vancomycin discontinued, ID recommended meropenem for total 7 days. -I appreciate ID help, ASO, ESR and CRP WNL, ANA, ANCA and Lyme pending.  Headache -The initial headache which is frontal improved this morning. -Headache is positional likely post-LP headache, neurology will try to arrange.  DVT prophylaxis:  Code Status: Full Code Family Communication:  Disposition Plan:  Diet: Diet regular Room service appropriate? Yes; Fluid consistency: Thin  Consultants:   ID  Procedures:   LP  Antimicrobials:   vancomycin and meropenem.   Objective: Vitals:   10/21/15 1712 10/21/15 2149 10/22/15 0506 10/22/15 1000  BP: 111/68 (!) 141/71 (!) 110/49 126/71  Pulse: 71 68 68 70  Resp: _0 Temp: 97.6 F (36.4 C) 97.5 F (36.4 C) 97.9 F (36.6 C) 98.3 F (36.8 C)  TempSrc: Oral Oral Oral Oral  SpO2: 100% 99% 97% 98%  Weight:      Height:        Intake/Output Summary (Last 24 hours) at 10/22/15 1008 Last data filed at 10/22/15 0557  Gross per 24 hour  Intake              700 ml  Output                0 ml  Net              700 ml   Filed Weights   10/18/15 0146 10/18/15 2223 10/19/15 2109  Weight: 93.9 kg (207 lb) 95.3 kg (210 lb 1.6 oz) 95.5 kg (210 lb 8.6 oz)    Examination: General exam:  Appears calm and comfortable  Respiratory system: Clear to auscultation. Respiratory effort normal. Cardiovascular system: S1 & S2 heard, RRR. No JVD, murmurs, rubs, gallops or clicks. No pedal edema. Gastrointestinal system: Abdomen is nondistended, soft and nontender. No organomegaly or masses felt. Normal bowel sounds heard. Central nervous system: Alert and oriented. No focal neurological deficits. Extremities: Symmetric 5 x 5 power. Skin: No rashes, lesions or ulcers Psychiatry: Judgement and insight appear normal. Mood & affect appropriate.   Data Reviewed: I have personally reviewed following labs and  imaging studies  CBC:  Recent Labs Lab 10/17/15 1813 10/18/15 0357 10/19/15 0540 10/21/15 1539  WBC 9.5 8.5 7.2 6.2  NEUTROABS 7.0  --   --   --   HGB 15.7 15.7 14.0 15.9  HCT 46.5 46.0 41.8 47.2  MCV 89.6 90.6 90.1 90.9  PLT 231 229 198 953   Basic Metabolic Panel:  Recent Labs Lab 10/17/15 1813 10/18/15 0357 10/19/15 0540 10/21/15 1539  NA 139 138 140 140  K 4.1 4.4 3.8 4.0  CL 105 104 105 105  CO2 _0 GLUCOSE 104* 142* 114* 102*  BUN _1 CREATININE 0.85 0.95 0.92 0.93  CALCIUM 9.6 9.3 8.9 9.0   GFR: Estimated Creatinine Clearance: 132.5 mL/min (by C-G formula based on SCr of 0.93 mg/dL). Liver Function Tests:  Recent Labs Lab 10/17/15 1813  AST 30  ALT 35  ALKPHOS 64  BILITOT 0.8  PROT 7.0  ALBUMIN 4.4   No results for input(s): LIPASE, AMYLASE in the last 168 hours. No results for input(s): AMMONIA in the last 168 hours. Coagulation Profile: No results for input(s): INR, PROTIME in the last 168 hours. Cardiac Enzymes: No results for input(s): CKTOTAL, CKMB, CKMBINDEX, TROPONINI in the last 168 hours. BNP (last 3 results) No results for input(s): PROBNP in the last 8760 hours. HbA1C: No results for input(s): HGBA1C in the last 72 hours. CBG: No results for input(s): GLUCAP in the last 168 hours. Lipid Profile: No results for input(s): CHOL, HDL, LDLCALC, TRIG, CHOLHDL, LDLDIRECT in the last 72 hours. Thyroid Function Tests: No results for input(s): TSH, T4TOTAL, FREET4, T3FREE, THYROIDAB in the last 72 hours. Anemia Panel:  Recent Labs  10/21/15 1813  VITAMINB12 512   Urine analysis:    Component Value Date/Time   COLORURINE YELLOW 10/17/2015 1837   APPEARANCEUR CLEAR 10/17/2015 1837   LABSPEC 1.025 10/17/2015 1837   PHURINE 8.0 10/17/2015 1837   GLUCOSEU NEGATIVE 10/17/2015 1837   HGBUR NEGATIVE 10/17/2015 1837   BILIRUBINUR NEGATIVE 10/17/2015 1837   KETONESUR NEGATIVE 10/17/2015 1837   PROTEINUR NEGATIVE  10/17/2015 1837   NITRITE NEGATIVE 10/17/2015 1837   LEUKOCYTESUR NEGATIVE 10/17/2015 1837   Sepsis Labs: _2 (procalcitonin:4,lacticidven:4)  ) Recent Results (from the past 240 hour(s))  CSF culture     Status: None   Collection Time: 10/17/15  9:30 PM  Result Value Ref Range Status   Specimen Description CSF  Final   Special Requests NONE  Final   Gram Stain   Final    CYTOSPIN SMEAR WBC PRESENT,BOTH PMN AND MONONUCLEAR NO ORGANISMS SEEN    Culture NO GROWTH 3 DAYS  Final   Report Status 10/21/2015 FINAL  Final     Invalid input(s): PROCALCITONIN, LACTICACIDVEN   Radiology Studies: Mr Angiogram Head Wo Contrast  Result Date: 10/21/2015 CLINICAL DATA:  30 year old male with severe headache, photophobia. Possible meningitis. Status post LP. Initial encounter. EXAM: MRI HEAD WITHOUT AND WITH CONTRAST  MRA HEAD WITHOUT CONTRAST TECHNIQUE: Multiplanar, multiecho pulse sequences of the brain and surrounding structures were obtained without and with intravenous contrast. Angiographic images of the head were obtained using MRA technique without contrast. CONTRAST:  61m MULTIHANCE GADOBENATE DIMEGLUMINE 529 MG/ML IV SOLN COMPARISON:  Head CT without contrast 10/17/2015. FINDINGS: MRI HEAD FINDINGS Brain: Cerebral volume is normal. No restricted diffusion to suggest acute infarction. No midline shift, mass effect, evidence of mass lesion, ventriculomegaly, extra-axial collection or acute intracranial hemorrhage. Cervicomedullary junction and pituitary are within normal limits. Occasional small subcortical white matter nonspecific foci of T2 and FLAIR hyperintensity (right operculum series 7, image 16). No cerebral edema identified. Otherwise gray and white matter signal is within normal limits. No cortical encephalomalacia or chronic cerebral blood products. No abnormal enhancement following contrast, small right cingulate region developmental venous anomaly (normal variant). No dural  thickening identified. Vascular: Major intracranial vascular flow voids appear normal. Skull and upper cervical spine: Negative. Visualized bone marrow signal is within normal limits. Sinuses/Orbits: Negative orbits soft tissues. mild paranasal sinus mucosal thickening. Other: Visible internal auditory structures appear normal. Mastoids are clear. Negative scalp soft tissues. MRA HEAD FINDINGS Antegrade flow in the posterior circulation with mildly dominant distal left vertebral artery. Normal bilateral PICA origins. Normal vertebrobasilar junction. No basilar stenosis. Normal SCA and PCA origins. Both posterior communicating arteries present. Normal PCA branches. Antegrade flow in both ICA siphons. No siphon stenosis. Normal ophthalmic and posterior communicating artery origins. Normal carotid termini, MCA and ACA origins. Normal anterior communicating artery and visualized bilateral ACA branches. Normal visualized MCA branches. IMPRESSION: 1.  Normal MRI appearance of the brain. 2.  Negative intracranial MRA. 3. Mild paranasal sinus inflammation. Electronically Signed   By: HGenevie AnnM.D.   On: 10/21/2015 21:37   Mr BJeri CosOr Wo Contrast  Result Date: 10/21/2015 CLINICAL DATA:  30year old male with severe headache, photophobia. Possible meningitis. Status post LP. Initial encounter. EXAM: MRI HEAD WITHOUT AND WITH CONTRAST MRA HEAD WITHOUT CONTRAST TECHNIQUE: Multiplanar, multiecho pulse sequences of the brain and surrounding structures were obtained without and with intravenous contrast. Angiographic images of the head were obtained using MRA technique without contrast. CONTRAST:  254mMULTIHANCE GADOBENATE DIMEGLUMINE 529 MG/ML IV SOLN COMPARISON:  Head CT without contrast 10/17/2015. FINDINGS: MRI HEAD FINDINGS Brain: Cerebral volume is normal. No restricted diffusion to suggest acute infarction. No midline shift, mass effect, evidence of mass lesion, ventriculomegaly, extra-axial collection or acute  intracranial hemorrhage. Cervicomedullary junction and pituitary are within normal limits. Occasional small subcortical white matter nonspecific foci of T2 and FLAIR hyperintensity (right operculum series 7, image 16). No cerebral edema identified. Otherwise gray and white matter signal is within normal limits. No cortical encephalomalacia or chronic cerebral blood products. No abnormal enhancement following contrast, small right cingulate region developmental venous anomaly (normal variant). No dural thickening identified. Vascular: Major intracranial vascular flow voids appear normal. Skull and upper cervical spine: Negative. Visualized bone marrow signal is within normal limits. Sinuses/Orbits: Negative orbits soft tissues. mild paranasal sinus mucosal thickening. Other: Visible internal auditory structures appear normal. Mastoids are clear. Negative scalp soft tissues. MRA HEAD FINDINGS Antegrade flow in the posterior circulation with mildly dominant distal left vertebral artery. Normal bilateral PICA origins. Normal vertebrobasilar junction. No basilar stenosis. Normal SCA and PCA origins. Both posterior communicating arteries present. Normal PCA branches. Antegrade flow in both ICA siphons. No siphon stenosis. Normal ophthalmic and posterior communicating artery origins. Normal carotid termini, MCA and ACA origins. Normal anterior communicating  artery and visualized bilateral ACA branches. Normal visualized MCA branches. IMPRESSION: 1.  Normal MRI appearance of the brain. 2.  Negative intracranial MRA. 3. Mild paranasal sinus inflammation. Electronically Signed   By: Genevie Ann M.D.   On: 10/21/2015 21:37        Scheduled Meds: . docusate sodium  100 mg Oral BID  . enoxaparin (LOVENOX) injection  40 mg Subcutaneous Q24H  . meropenem (MERREM) IV  2 g Intravenous Q8H  . polyethylene glycol  17 g Oral Daily   Continuous Infusions:    LOS: 4 days    Time spent: 35 minutes    Maicee Ullman A,  MD [facility] Pager [phone]  If 7PM-7AM, please contact night-coverage [email or web address] Password [email or web address] 10/22/2015, 10:08 AM

## 2015-10-23 MED ORDER — SODIUM CHLORIDE 0.9 % IV BOLUS (SEPSIS)
1000.0000 mL | Freq: Once | INTRAVENOUS | Status: AC
  Administered 2015-10-23: 1000 mL via INTRAVENOUS

## 2015-10-23 MED ORDER — PROMETHAZINE HCL 25 MG/ML IJ SOLN
12.5000 mg | Freq: Four times a day (QID) | INTRAMUSCULAR | Status: DC | PRN
Start: 2015-10-23 — End: 2015-10-24
  Administered 2015-10-23: 12.5 mg via INTRAVENOUS
  Filled 2015-10-23: qty 1

## 2015-10-23 NOTE — Progress Notes (Signed)
Patient vomited and stated feeling clammy. RN administered PRN IV Zofran. RN reassessed patient and patient stated no relief with Zofran. RN notified on call MD, Selena BattenKim, and order for 12.5 mg Phenergan placed. RN administered medication. RN to reassess patient and found patient resting. RN will continue to monitor patient.  Veatrice KellsMahmoud,Starr Engel I, RN

## 2015-10-23 NOTE — Progress Notes (Signed)
PROGRESS NOTE  Jeffery Ferguson  LDJ:[id number] DOB: 09-23-1985 DOA: 10/17/2015 PCP: No primary care provider on file. Outpatient Specialists:  Subjective: I have seen him sitting at bedside this morning, denies any headache but reported his headache usually comes later in the day. Reported have nausea and vomiting around midnight (got pain medication around 9:30 PM), no nausea now.  Brief Narrative:  Jeffery Ferguson is a 30 y.o. male with no significant past medical history who presents with headache for 1 day.  History is collected mostly from the patient's father, at the bedside, as the patient is sleepy from diphenhydramine. Father reports that the patient [place] highways [place] bridges [place], and has had care over the last month from several different urgent cares and emergency rooms. About 2 weeks ago, he was seen somewhere for sore throat, diagnosed with strep, and prescribed amoxicillin. A few days later, he was seen in the ER for severe chest pain, had "CT scans" at other workup that was all negative, and was diagnosed with pleurisy, which the father was given a believe was from the strep throat.  Since then, the patient has completed amoxicillin, and been otherwise well until this morning when he woke up with an posterior headache. This progressed over the course of the day until it was severe in intensity, became associated with nausea and vomiting,  chills at times, photophobia, one episode of syncope per patient's mother, and dizziness, and so he came to the hospital.  Assessment & Plan:   Principal Problem:   Meningitis Active Problems:   Spinal headache   Peripheral neuropathy ([place])   Possible meningitis -Patient presented with headaches, does not have nuchal rigidity now but he reported some on admission. -Temperature of 100.4, afebrile today, continue current antibiotics. -CSF showed 24 WBCs which is 84-89% neutrophils, protein is 54 and glucose is 59. -CSF Gram  stain without any organism. -CSF studies looks like it could be secondary to viral meningitis or partially treated bacterial meningitis. -CSF cultures negative, vancomycin discontinued, ID recommended meropenem for total 7 days. -I appreciate ID help, ASO, ESR and CRP WNL, ANA, ANCA and Lyme pending.  Headache -The initial headache which is frontal which is resolved. -Has occasional headache position dependent mostly consistent with spinal post LP headache. -I appreciate Dr. Shon Hale help, per IR no blood patch procedures on the weekend -Continue antibiotics, continue to monitor if he still have a headache in a.m. we'll consider it. -I'll give 1 L of IV fluids, continue caffeinated beverages.  DVT prophylaxis:  Code Status: Full Code Family Communication:  Disposition Plan:  Diet: Diet regular Room service appropriate? Yes; Fluid consistency: Thin  Consultants:   ID  Procedures:   LP  Antimicrobials:   vancomycin and meropenem.   Objective: Vitals:   10/22/15 1740 10/22/15 2058 10/23/15 0502 10/23/15 1025  BP: 127/68 (!) 136/52 116/64 (!) 100/49  Pulse: 76 82 66 99  Resp: '18 18 17 16  '$ Temp: 98.3 F (36.8 C) 97.8 F (36.6 C) 97.8 F (36.6 C) 98.7 F (37.1 C)  TempSrc: Oral Oral Oral Oral  SpO2: 99% 98% 97% 99%  Weight:  95.7 kg (210 lb 15.7 oz)    Height:        Intake/Output Summary (Last 24 hours) at 10/23/15 1045 Last data filed at 10/23/15 1027  Gross per 24 hour  Intake              920 ml  Output  0 ml  Net              920 ml   Filed Weights   10/18/15 2223 10/19/15 2109 10/22/15 2058  Weight: 95.3 kg (210 lb 1.6 oz) 95.5 kg (210 lb 8.6 oz) 95.7 kg (210 lb 15.7 oz)    Examination: General exam: Appears calm and comfortable  Respiratory system: Clear to auscultation. Respiratory effort normal. Cardiovascular system: S1 & S2 heard, RRR. No JVD, murmurs, rubs, gallops or clicks. No pedal edema. Gastrointestinal system: Abdomen is  nondistended, soft and nontender. No organomegaly or masses felt. Normal bowel sounds heard. Central nervous system: Alert and oriented. No focal neurological deficits. Extremities: Symmetric 5 x 5 power. Skin: No rashes, lesions or ulcers Psychiatry: Judgement and insight appear normal. Mood & affect appropriate.   Data Reviewed: I have personally reviewed following labs and imaging studies  CBC:  Recent Labs Lab 10/17/15 1813 10/18/15 0357 10/19/15 0540 10/21/15 1539  WBC 9.5 8.5 7.2 6.2  NEUTROABS 7.0  --   --   --   HGB 15.7 15.7 14.0 15.9  HCT 46.5 46.0 41.8 47.2  MCV 89.6 90.6 90.1 90.9  PLT 231 229 198 092   Basic Metabolic Panel:  Recent Labs Lab 10/17/15 1813 10/18/15 0357 10/19/15 0540 10/21/15 1539  NA 139 138 140 140  K 4.1 4.4 3.8 4.0  CL 105 104 105 105  CO2 '24 25 26 26  '$ GLUCOSE 104* 142* 114* 102*  BUN '12 10 12 9  '$ CREATININE 0.85 0.95 0.92 0.93  CALCIUM 9.6 9.3 8.9 9.0   GFR: Estimated Creatinine Clearance: 132.5 mL/min (by C-G formula based on SCr of 0.93 mg/dL). Liver Function Tests:  Recent Labs Lab 10/17/15 1813  AST 30  ALT 35  ALKPHOS 64  BILITOT 0.8  PROT 7.0  ALBUMIN 4.4   No results for input(s): LIPASE, AMYLASE in the last 168 hours. No results for input(s): AMMONIA in the last 168 hours. Coagulation Profile: No results for input(s): INR, PROTIME in the last 168 hours. Cardiac Enzymes: No results for input(s): CKTOTAL, CKMB, CKMBINDEX, TROPONINI in the last 168 hours. BNP (last 3 results) No results for input(s): PROBNP in the last 8760 hours. HbA1C: No results for input(s): HGBA1C in the last 72 hours. CBG: No results for input(s): GLUCAP in the last 168 hours. Lipid Profile: No results for input(s): CHOL, HDL, LDLCALC, TRIG, CHOLHDL, LDLDIRECT in the last 72 hours. Thyroid Function Tests: No results for input(s): TSH, T4TOTAL, FREET4, T3FREE, THYROIDAB in the last 72 hours. Anemia Panel:  Recent Labs  10/21/15 1813    VITAMINB12 512   Urine analysis:    Component Value Date/Time   COLORURINE YELLOW 10/17/2015 1837   APPEARANCEUR CLEAR 10/17/2015 1837   LABSPEC 1.025 10/17/2015 1837   PHURINE 8.0 10/17/2015 1837   GLUCOSEU NEGATIVE 10/17/2015 1837   HGBUR NEGATIVE 10/17/2015 1837   BILIRUBINUR NEGATIVE 10/17/2015 1837   KETONESUR NEGATIVE 10/17/2015 1837   PROTEINUR NEGATIVE 10/17/2015 1837   NITRITE NEGATIVE 10/17/2015 1837   LEUKOCYTESUR NEGATIVE 10/17/2015 1837   Sepsis Labs: '@LABRCNTIP'$ (procalcitonin:4,lacticidven:4)  ) Recent Results (from the past 240 hour(s))  CSF culture     Status: None   Collection Time: 10/17/15  9:30 PM  Result Value Ref Range Status   Specimen Description CSF  Final   Special Requests NONE  Final   Gram Stain   Final    CYTOSPIN SMEAR WBC PRESENT,BOTH PMN AND MONONUCLEAR NO ORGANISMS SEEN    Culture  NO GROWTH 3 DAYS  Final   Report Status 10/21/2015 FINAL  Final     Invalid input(s): PROCALCITONIN, LACTICACIDVEN   Radiology Studies: Mr Angiogram Head Wo Contrast  Result Date: 10/21/2015 CLINICAL DATA:  30 year old male with severe headache, photophobia. Possible meningitis. Status post LP. Initial encounter. EXAM: MRI HEAD WITHOUT AND WITH CONTRAST MRA HEAD WITHOUT CONTRAST TECHNIQUE: Multiplanar, multiecho pulse sequences of the brain and surrounding structures were obtained without and with intravenous contrast. Angiographic images of the head were obtained using MRA technique without contrast. CONTRAST:  69m MULTIHANCE GADOBENATE DIMEGLUMINE 529 MG/ML IV SOLN COMPARISON:  Head CT without contrast 10/17/2015. FINDINGS: MRI HEAD FINDINGS Brain: Cerebral volume is normal. No restricted diffusion to suggest acute infarction. No midline shift, mass effect, evidence of mass lesion, ventriculomegaly, extra-axial collection or acute intracranial hemorrhage. Cervicomedullary junction and pituitary are within normal limits. Occasional small subcortical white matter  nonspecific foci of T2 and FLAIR hyperintensity (right operculum series 7, image 16). No cerebral edema identified. Otherwise gray and white matter signal is within normal limits. No cortical encephalomalacia or chronic cerebral blood products. No abnormal enhancement following contrast, small right cingulate region developmental venous anomaly (normal variant). No dural thickening identified. Vascular: Major intracranial vascular flow voids appear normal. Skull and upper cervical spine: Negative. Visualized bone marrow signal is within normal limits. Sinuses/Orbits: Negative orbits soft tissues. mild paranasal sinus mucosal thickening. Other: Visible internal auditory structures appear normal. Mastoids are clear. Negative scalp soft tissues. MRA HEAD FINDINGS Antegrade flow in the posterior circulation with mildly dominant distal left vertebral artery. Normal bilateral PICA origins. Normal vertebrobasilar junction. No basilar stenosis. Normal SCA and PCA origins. Both posterior communicating arteries present. Normal PCA branches. Antegrade flow in both ICA siphons. No siphon stenosis. Normal ophthalmic and posterior communicating artery origins. Normal carotid termini, MCA and ACA origins. Normal anterior communicating artery and visualized bilateral ACA branches. Normal visualized MCA branches. IMPRESSION: 1.  Normal MRI appearance of the brain. 2.  Negative intracranial MRA. 3. Mild paranasal sinus inflammation. Electronically Signed   By: HGenevie AnnM.D.   On: 10/21/2015 21:37   Mr BJeri CosOr Wo Contrast  Result Date: 10/21/2015 CLINICAL DATA:  30year old male with severe headache, photophobia. Possible meningitis. Status post LP. Initial encounter. EXAM: MRI HEAD WITHOUT AND WITH CONTRAST MRA HEAD WITHOUT CONTRAST TECHNIQUE: Multiplanar, multiecho pulse sequences of the brain and surrounding structures were obtained without and with intravenous contrast. Angiographic images of the head were obtained using MRA  technique without contrast. CONTRAST:  273mMULTIHANCE GADOBENATE DIMEGLUMINE 529 MG/ML IV SOLN COMPARISON:  Head CT without contrast 10/17/2015. FINDINGS: MRI HEAD FINDINGS Brain: Cerebral volume is normal. No restricted diffusion to suggest acute infarction. No midline shift, mass effect, evidence of mass lesion, ventriculomegaly, extra-axial collection or acute intracranial hemorrhage. Cervicomedullary junction and pituitary are within normal limits. Occasional small subcortical white matter nonspecific foci of T2 and FLAIR hyperintensity (right operculum series 7, image 16). No cerebral edema identified. Otherwise gray and white matter signal is within normal limits. No cortical encephalomalacia or chronic cerebral blood products. No abnormal enhancement following contrast, small right cingulate region developmental venous anomaly (normal variant). No dural thickening identified. Vascular: Major intracranial vascular flow voids appear normal. Skull and upper cervical spine: Negative. Visualized bone marrow signal is within normal limits. Sinuses/Orbits: Negative orbits soft tissues. mild paranasal sinus mucosal thickening. Other: Visible internal auditory structures appear normal. Mastoids are clear. Negative scalp soft tissues. MRA HEAD FINDINGS Antegrade flow in the  posterior circulation with mildly dominant distal left vertebral artery. Normal bilateral PICA origins. Normal vertebrobasilar junction. No basilar stenosis. Normal SCA and PCA origins. Both posterior communicating arteries present. Normal PCA branches. Antegrade flow in both ICA siphons. No siphon stenosis. Normal ophthalmic and posterior communicating artery origins. Normal carotid termini, MCA and ACA origins. Normal anterior communicating artery and visualized bilateral ACA branches. Normal visualized MCA branches. IMPRESSION: 1.  Normal MRI appearance of the brain. 2.  Negative intracranial MRA. 3. Mild paranasal sinus inflammation.  Electronically Signed   By: Genevie Ann M.D.   On: 10/21/2015 21:37        Scheduled Meds: . docusate sodium  100 mg Oral BID  . enoxaparin (LOVENOX) injection  40 mg Subcutaneous Q24H  . meropenem (MERREM) IV  2 g Intravenous Q8H  . polyethylene glycol  17 g Oral Daily  . sodium chloride  1,000 mL Intravenous Once   Continuous Infusions:    LOS: 5 days    Time spent: 35 minutes    Christinia Lambeth A, MD [facility] Pager [phone]  If 7PM-7AM, please contact night-coverage [email or web address] Password [email or web address] 10/23/2015, 10:45 AM

## 2015-10-23 NOTE — Progress Notes (Signed)
Neurology Progress Note  Subjective: He had some nausea and vomiting last night. Today he was able to sit up for about one hour before severe headache started, though he still noted increased pressure in his head after about 10 minutes. He has some mild muscle tension in the back of his head and neck this morning. He is hoping that he can get a blood patch and go home tomorrow as he is sick of lying around in bed.    Current Meds:   Current Facility-Administered Medications:  .  acetaminophen (TYLENOL) tablet 650 mg, 650 mg, Oral, Q6H PRN, 650 mg at 10/23/15 1056 **OR** acetaminophen (TYLENOL) suppository 650 mg, 650 mg, Rectal, Q6H PRN, Alberteen Samhristopher P Danford, MD .  diphenhydrAMINE (BENADRYL) capsule 25 mg, 25 mg, Oral, QHS PRN, Leda GauzeKaren J Kirby-Graham, NP, 25 mg at 10/19/15 0052 .  docusate sodium (COLACE) capsule 100 mg, 100 mg, Oral, BID, Clydia LlanoMutaz Elmahi, MD, 100 mg at 10/23/15 0054 .  enoxaparin (LOVENOX) injection 40 mg, 40 mg, Subcutaneous, Q24H, Alberteen Samhristopher P Danford, MD, 40 mg at 10/23/15 1031 .  meropenem (MERREM) 2 g in sodium chloride 0.9 % 100 mL IVPB, 2 g, Intravenous, Q8H, Veronda P Bryk, RPH, 2 g at 10/23/15 0924 .  ondansetron (ZOFRAN) tablet 4 mg, 4 mg, Oral, Q6H PRN **OR** ondansetron (ZOFRAN) injection 4 mg, 4 mg, Intravenous, Q6H PRN, Alberteen Samhristopher P Danford, MD, 4 mg at 10/22/15 2251 .  oxyCODONE-acetaminophen (PERCOCET/ROXICET) 5-325 MG per tablet 1-2 tablet, 1-2 tablet, Oral, Q4H PRN, Clydia LlanoMutaz Elmahi, MD, 1 tablet at 10/22/15 2033 .  polyethylene glycol (MIRALAX / GLYCOLAX) packet 17 g, 17 g, Oral, Daily, Clydia LlanoMutaz Elmahi, MD, 17 g at 10/22/15 1025 .  promethazine (PHENERGAN) injection 12.5 mg, 12.5 mg, Intravenous, Q6H PRN, Pearson GrippeJames Kim, MD, 12.5 mg at 10/23/15 0108 .  senna-docusate (Senokot-S) tablet 1 tablet, 1 tablet, Oral, QHS PRN, Alberteen Samhristopher P Danford, MD, 1 tablet at 10/20/15 1039 .  sodium chloride 0.9 % bolus 1,000 mL, 1,000 mL, Intravenous, Once, Clydia LlanoMutaz Elmahi, MD, 1,000 mL at  10/23/15 1033  Objective:  Temp:  [97.8 F (36.6 C)-98.7 F (37.1 C)] 98.7 F (37.1 C) (10/01 1025) Pulse Rate:  [66-99] 99 (10/01 1025) Resp:  [16-18] 16 (10/01 1025) BP: (100-136)/(49-68) 100/49 (10/01 1025) SpO2:  [97 %-99 %] 99 % (10/01 1025) Weight:  [95.7 kg (210 lb 15.7 oz)] 95.7 kg (210 lb 15.7 oz) (09/30 2058)  General: WDWN resting in bed in NAD. Alert, oriented x4. Speech is clear without dysarthria. Affect is bright. Comportment is normal.  Neuro: MS: As noted above. No aphasia.  CN: Pupils are equal. EOMI, no nystagmus. Face is symmetric at rest with normal strength and mobility. Hearing is intact to conversational voice.  Motor: Grossly normal bulk, tone, and strength throughout. No tremor or other abnormal movements are observed.   Labs: Lab Results  Component Value Date   WBC 6.2 10/21/2015   HGB 15.9 10/21/2015   HCT 47.2 10/21/2015   PLT 214 10/21/2015   GLUCOSE 102 (H) 10/21/2015   ALT 35 10/17/2015   AST 30 10/17/2015   NA 140 10/21/2015   K 4.0 10/21/2015   CL 105 10/21/2015   CREATININE 0.93 10/21/2015   BUN 9 10/21/2015   CO2 26 10/21/2015   CBC Latest Ref Rng & Units 10/21/2015 10/19/2015 10/18/2015  WBC 4.0 - 10.5 K/uL 6.2 7.2 8.5  Hemoglobin 13.0 - 17.0 g/dL 16.115.9 09.614.0 04.515.7  Hematocrit 39.0 - 52.0 % 47.2 41.8 46.0  Platelets 150 -  400 K/uL 214 198 229    No results found for: HGBA1C Lab Results  Component Value Date   ALT 35 10/17/2015   AST 30 10/17/2015   ALKPHOS 64 10/17/2015   BILITOT 0.8 10/17/2015   B12 512 Myeloperoxidase Abs negative ANCA proteinase 3 antibodies negative  Pending studies:  CSF arbovirus panel CSF enterovirus PCR Anti-NMDA receptor Abs  Radiology:  No new neuroimaging.   A/P:   1. Meningitis: Suspect ibuprofen-induced aseptic meningitis versus poststreptococcal immune response. The other main consideration would be viral meningitis 2/2 arbovirus or enterovirus. Partially treated bacterial meningitis is  possible but seems less likely given that he did not develop meningeal symptoms until several days after he had completed his course of antibiotic. Treatment is supportive. ID has recommended that he complete a seven day course of meropenem. Ensure adequate fluid intake. Avoid NSAIDs, particularly ibuprofen. There is not thought to be cross-reactivity with other NSAIDs though there are case reports of some individuals developing meningitis with multiple NSAIDs.    2. Spinal headache: This has been present since his LP in the ED without much improvement after fluids and caffeine. Recommend blood patch in AM if headache persists. Avoid NSAIDs in treating headache and pain given suspicion for NSAID-induced meningitis.   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 B12 normal at 512. HIV, RPR nonreactive. No evidence of thyroid disease so will defer. He has had some supranormal glucoses this admission but this is in the setting of acute illness. This should be monitored as glucose intolerance can produce neuropathy well before frank DM is present and is a common cause of neuropathy.     Rhona Leavens, MD [facility]

## 2015-10-24 ENCOUNTER — Encounter (HOSPITAL_COMMUNITY): Payer: Self-pay | Admitting: Interventional Radiology

## 2015-10-24 ENCOUNTER — Inpatient Hospital Stay (HOSPITAL_COMMUNITY): Payer: Commercial Managed Care - PPO

## 2015-10-24 DIAGNOSIS — G0490 Encephalitis and encephalomyelitis, unspecified: Principal | ICD-10-CM

## 2015-10-24 DIAGNOSIS — G0481 Other encephalitis and encephalomyelitis: Secondary | ICD-10-CM

## 2015-10-24 HISTORY — PX: IR GENERIC HISTORICAL: IMG1180011

## 2015-10-24 MED ORDER — SODIUM CHLORIDE 0.9 % IJ SOLN
INTRAMUSCULAR | Status: AC
  Filled 2015-10-24: qty 10

## 2015-10-24 MED ORDER — LIDOCAINE HCL (PF) 2 % IJ SOLN
INTRAMUSCULAR | Status: AC
  Filled 2015-10-24: qty 10

## 2015-10-24 MED ORDER — LIDOCAINE HCL 1 % IJ SOLN
INTRAMUSCULAR | Status: DC | PRN
Start: 2015-10-24 — End: 2015-10-24
  Administered 2015-10-24: 5 mL

## 2015-10-24 MED ORDER — IOPAMIDOL (ISOVUE-M 200) INJECTION 41%
INTRAMUSCULAR | Status: AC
  Administered 2015-10-24: 3 mL
  Filled 2015-10-24: qty 10

## 2015-10-24 NOTE — Progress Notes (Signed)
Subjective:  Patient is upset about the delay in getting blood patch to treat his post spinal headache   Antibiotics:  Anti-infectives    Start     Dose/Rate Route Frequency Ordered Stop   10/18/15 0800  vancomycin (VANCOCIN) IVPB 1000 mg/200 mL premix  Status:  Discontinued     1,000 mg 200 mL/hr over 60 Minutes Intravenous Every 8 hours 10/18/15 0117 10/21/15 1544   10/18/15 0400  meropenem (MERREM) 2 g in sodium chloride 0.9 % 100 mL IVPB     2 g 200 mL/hr over 30 Minutes Intravenous Every 8 hours 10/18/15 0117     10/17/15 2345  meropenem (MERREM) 2 g in sodium chloride 0.9 % 100 mL IVPB     2 g 200 mL/hr over 30 Minutes Intravenous  Once 10/17/15 2334 10/18/15 0132   10/17/15 2330  meropenem (MERREM) 1 g in sodium chloride 0.9 % 100 mL IVPB  Status:  Discontinued     1 g 200 mL/hr over 30 Minutes Intravenous  Once 10/17/15 2326 10/17/15 2334   10/17/15 2315  vancomycin (VANCOCIN) 1,500 mg in sodium chloride 0.9 % 500 mL IVPB     1,500 mg 250 mL/hr over 120 Minutes Intravenous  Once 10/17/15 2254 10/18/15 0131      Medications: Scheduled Meds: . docusate sodium  100 mg Oral BID  . meropenem (MERREM) IV  2 g Intravenous Q8H  . polyethylene glycol  17 g Oral Daily   Continuous Infusions:  PRN Meds:.acetaminophen **OR** acetaminophen, diphenhydrAMINE, ondansetron **OR** ondansetron (ZOFRAN) IV, oxyCODONE-acetaminophen, promethazine, senna-docusate    Objective: Weight change:   Intake/Output Summary (Last 24 hours) at 10/24/15 1225 Last data filed at 10/24/15 0942  Gross per 24 hour  Intake              580 ml  Output                0 ml  Net              580 ml   Blood pressure 95/81, pulse 64, temperature 98.1 F (36.7 C), temperature source Oral, resp. rate 18, height 6\' 1"  (1.854 m), weight 210 lb 15.7 oz (95.7 kg), SpO2 99 %. Temp:  [97.6 F (36.4 C)-98.1 F (36.7 C)] 98.1 F (36.7 C) (10/02 1000) Pulse Rate:  [64-79] 64 (10/02 1000) Resp:   [16-18] 18 (10/02 1000) BP: (95-127)/(56-81) 95/81 (10/02 1000) SpO2:  [99 %] 99 % (10/02 1000)  Physical Exam: General: Alert and awake, oriented x3, not in any acute distress. HEENT: anicteric sclera, pupils reactive to light and accommodation, EOMI CVS regular rate, normal r,  no murmur rubs or gallops Chest: clear to auscultation bilaterally, no wheezing, rales or rhonchi Abdomen: soft nontender, nondistended, normal bowel sounds, Extremities: no  clubbing or edema noted bilaterally Skin: no rashes Lymph: no new lymphadenopathy Neuro: nonfocal  CBC:  CBC Latest Ref Rng & Units 10/21/2015 10/19/2015 10/18/2015  WBC 4.0 - 10.5 K/uL 6.2 7.2 8.5  Hemoglobin 13.0 - 17.0 g/dL 16.115.9 09.614.0 04.515.7  Hematocrit 39.0 - 52.0 % 47.2 41.8 46.0  Platelets 150 - 400 K/uL 214 198 229      BMET  Recent Labs  10/21/15 1539  NA 140  K 4.0  CL 105  CO2 26  GLUCOSE 102*  BUN 9  CREATININE 0.93  CALCIUM 9.0     Liver Panel  No results for input(s): PROT, ALBUMIN, AST, ALT, ALKPHOS, BILITOT,  BILIDIR, IBILI in the last 72 hours.     Sedimentation Rate No results for input(s): ESRSEDRATE in the last 72 hours. C-Reactive Protein No results for input(s): CRP in the last 72 hours.  Micro Results: Recent Results (from the past 720 hour(s))  CSF culture     Status: None   Collection Time: 10/17/15  9:30 PM  Result Value Ref Range Status   Specimen Description CSF  Final   Special Requests NONE  Final   Gram Stain   Final    CYTOSPIN SMEAR WBC PRESENT,BOTH PMN AND MONONUCLEAR NO ORGANISMS SEEN    Culture NO GROWTH 3 DAYS  Final   Report Status 10/21/2015 FINAL  Final    Studies/Results: No results found.    Assessment/Plan:  INTERVAL HISTORY:  MRI brain unremarkable   Principal Problem:   Meningitis Active Problems:   Spinal headache   Peripheral neuropathy ([facility])    Jeffery Ferguson is a 30 y.o. male with  admission for meningoencephalitis  #1 Meningoencephalitis:  We ended up treating him for possible partially treated bacterial meningitis though I am skeptical of this diagnosis given the degree of confusion he had during his admission when he had a lumbar puncture I think if he still had a very active bacterial meningitis at that point in time that was sufficient to cause confusion he would've been with a different CSF profile.  Regardless he is about to complete his 7 days of meropenem and should be ready to discharge today.  There is slough of labs still pending some of which may not had time to turn positive yet. I will set him up to come to our infectious disease clinic in one month's time to repeat his arbovirus panel for example.  I will sign off for now please call with further questions      LOS: 6 days   [name] 10/24/2015, 12:25 PM

## 2015-10-24 NOTE — H&P (Signed)
Chief Complaint: Patient was seen in consultation today for blood patch procedure Chief Complaint  Patient presents with  . Headache   at the request of Dr Ahmed Prima  Referring Physician(s): Dr Ahmed Prima  Supervising Physician: Oley Balm  Patient Status: Inpatient  History of Present Illness: Jeffery Ferguson is a 30 y.o. male   Pt was seen and treated for strept throat last week Then treated for pleurisy with NSAIDs Developed frontal headache that would not subside And presented to ED9/25 /17 LP in ED that evening; colorless T 100.4 Wbc 24 on tap; Cx no growth  Pt has lain in bed for days---up to bathroom Caffeine and pain meds no real relief Definite increase in headache with standing Low grade headache= posterior head while lying in bed +Nausea Lovenox held today  Request for blood patch per Neuro Rhona Leavens MD Review with Dr Deanne Coffer - approves procedure  Past Medical History:  Diagnosis Date  . DVT (deep venous thrombosis) (HCC)     Past Surgical History:  Procedure Laterality Date  . APPENDECTOMY  2009    Allergies: Penicillins and Cephalexin  Medications: Prior to Admission medications   Medication Sig Start Date End Date Taking? Authorizing Provider  metaxalone (SKELAXIN) 800 MG tablet Take 800 mg by mouth at bedtime as needed for muscle spasms.    Yes Historical Provider, MD  omeprazole (PRILOSEC) 20 MG capsule Take 20 mg by mouth 2 (two) times daily. 08/11/15  Yes Historical Provider, MD  traMADol (ULTRAM) 50 MG tablet Take 100 mg by mouth 3 (three) times daily as needed for moderate pain.  08/11/15  Yes Historical Provider, MD  oxyCODONE-acetaminophen (PERCOCET/ROXICET) 5-325 MG tablet Take 1-2 tablets by mouth every 4 (four) hours as needed for moderate pain. 10/20/15   Clydia Llano, MD     Family History  Problem Relation Age of Onset  . Cancer Other   . Heart disease Other     Social History   Social History  . Marital status:  Married    Spouse name: N/A  . Number of children: N/A  . Years of education: N/A   Social History Main Topics  . Smoking status: Never Smoker  . Smokeless tobacco: Current User    Types: Chew  . Alcohol use No     Comment: occ  . Drug use: No  . Sexual activity: Yes    Partners: Female   Other Topics Concern  . None   Social History Narrative  . None     Review of Systems: A 12 point ROS discussed and pertinent positives are indicated in the HPI above.  All other systems are negative.  Review of Systems  Constitutional: Positive for activity change and appetite change. Negative for fatigue and fever.  HENT: Negative for sore throat.   Respiratory: Negative for shortness of breath.   Cardiovascular: Negative for chest pain.  Gastrointestinal: Negative for abdominal pain.  Musculoskeletal: Negative for back pain and gait problem.  Neurological: Positive for headaches. Negative for weakness.  Psychiatric/Behavioral: Negative for behavioral problems and confusion.    Vital Signs: BP 95/81 (BP Location: Right Arm)   Pulse 64   Temp 98.1 F (36.7 C) (Oral)   Resp 18   Ht 6\' 1"  (1.854 m)   Wt 210 lb 15.7 oz (95.7 kg)   SpO2 99%   BMI 27.84 kg/m   Physical Exam  Constitutional: He is oriented to person, place, and time.  Cardiovascular: Normal rate and regular  rhythm.   Pulmonary/Chest: Effort normal and breath sounds normal.  Abdominal: Soft. Bowel sounds are normal.  Musculoskeletal: Normal range of motion.  Neurological: He is alert and oriented to person, place, and time.  Skin: Skin is warm and dry.  Psychiatric: He has a normal mood and affect. His behavior is normal. Judgment and thought content normal.  Nursing note and vitals reviewed.   Mallampati Score:  MD Evaluation Airway: WNL Heart: WNL Abdomen: WNL ASA  Classification: 2 Mallampati/Airway Score: One  Imaging: Ct Head Wo Contrast  Result Date: 10/17/2015 CLINICAL DATA:  30 y/o M;  headache with chills, nausea, and vomiting. EXAM: CT HEAD WITHOUT CONTRAST TECHNIQUE: Contiguous axial images were obtained from the base of the skull through the vertex without intravenous contrast. COMPARISON:  None. FINDINGS: Brain: No evidence of acute infarction, hemorrhage, hydrocephalus, extra-axial collection or mass lesion/mass effect. Vascular: No hyperdense vessel or unexpected calcification. Skull: Normal. Negative for fracture or focal lesion. Sinuses/Orbits: No acute finding. Other: None. IMPRESSION: No acute intracranial abnormality is identified. Unremarkable CT of head for age. Electronically Signed   By: Mitzi HansenLance  Furusawa-Stratton M.D.   On: 10/17/2015 20:17   Mr Angiogram Head Wo Contrast  Result Date: 10/21/2015 CLINICAL DATA:  30 year old male with severe headache, photophobia. Possible meningitis. Status post LP. Initial encounter. EXAM: MRI HEAD WITHOUT AND WITH CONTRAST MRA HEAD WITHOUT CONTRAST TECHNIQUE: Multiplanar, multiecho pulse sequences of the brain and surrounding structures were obtained without and with intravenous contrast. Angiographic images of the head were obtained using MRA technique without contrast. CONTRAST:  20mL MULTIHANCE GADOBENATE DIMEGLUMINE 529 MG/ML IV SOLN COMPARISON:  Head CT without contrast 10/17/2015. FINDINGS: MRI HEAD FINDINGS Brain: Cerebral volume is normal. No restricted diffusion to suggest acute infarction. No midline shift, mass effect, evidence of mass lesion, ventriculomegaly, extra-axial collection or acute intracranial hemorrhage. Cervicomedullary junction and pituitary are within normal limits. Occasional small subcortical white matter nonspecific foci of T2 and FLAIR hyperintensity (right operculum series 7, image 16). No cerebral edema identified. Otherwise gray and white matter signal is within normal limits. No cortical encephalomalacia or chronic cerebral blood products. No abnormal enhancement following contrast, small right cingulate  region developmental venous anomaly (normal variant). No dural thickening identified. Vascular: Major intracranial vascular flow voids appear normal. Skull and upper cervical spine: Negative. Visualized bone marrow signal is within normal limits. Sinuses/Orbits: Negative orbits soft tissues. mild paranasal sinus mucosal thickening. Other: Visible internal auditory structures appear normal. Mastoids are clear. Negative scalp soft tissues. MRA HEAD FINDINGS Antegrade flow in the posterior circulation with mildly dominant distal left vertebral artery. Normal bilateral PICA origins. Normal vertebrobasilar junction. No basilar stenosis. Normal SCA and PCA origins. Both posterior communicating arteries present. Normal PCA branches. Antegrade flow in both ICA siphons. No siphon stenosis. Normal ophthalmic and posterior communicating artery origins. Normal carotid termini, MCA and ACA origins. Normal anterior communicating artery and visualized bilateral ACA branches. Normal visualized MCA branches. IMPRESSION: 1.  Normal MRI appearance of the brain. 2.  Negative intracranial MRA. 3. Mild paranasal sinus inflammation. Electronically Signed   By: Odessa FlemingH  Hall M.D.   On: 10/21/2015 21:37   Mr Laqueta JeanBrain W Or Wo Contrast  Result Date: 10/21/2015 CLINICAL DATA:  30 year old male with severe headache, photophobia. Possible meningitis. Status post LP. Initial encounter. EXAM: MRI HEAD WITHOUT AND WITH CONTRAST MRA HEAD WITHOUT CONTRAST TECHNIQUE: Multiplanar, multiecho pulse sequences of the brain and surrounding structures were obtained without and with intravenous contrast. Angiographic images of the head were obtained  using MRA technique without contrast. CONTRAST:  20mL MULTIHANCE GADOBENATE DIMEGLUMINE 529 MG/ML IV SOLN COMPARISON:  Head CT without contrast 10/17/2015. FINDINGS: MRI HEAD FINDINGS Brain: Cerebral volume is normal. No restricted diffusion to suggest acute infarction. No midline shift, mass effect, evidence of mass  lesion, ventriculomegaly, extra-axial collection or acute intracranial hemorrhage. Cervicomedullary junction and pituitary are within normal limits. Occasional small subcortical white matter nonspecific foci of T2 and FLAIR hyperintensity (right operculum series 7, image 16). No cerebral edema identified. Otherwise gray and white matter signal is within normal limits. No cortical encephalomalacia or chronic cerebral blood products. No abnormal enhancement following contrast, small right cingulate region developmental venous anomaly (normal variant). No dural thickening identified. Vascular: Major intracranial vascular flow voids appear normal. Skull and upper cervical spine: Negative. Visualized bone marrow signal is within normal limits. Sinuses/Orbits: Negative orbits soft tissues. mild paranasal sinus mucosal thickening. Other: Visible internal auditory structures appear normal. Mastoids are clear. Negative scalp soft tissues. MRA HEAD FINDINGS Antegrade flow in the posterior circulation with mildly dominant distal left vertebral artery. Normal bilateral PICA origins. Normal vertebrobasilar junction. No basilar stenosis. Normal SCA and PCA origins. Both posterior communicating arteries present. Normal PCA branches. Antegrade flow in both ICA siphons. No siphon stenosis. Normal ophthalmic and posterior communicating artery origins. Normal carotid termini, MCA and ACA origins. Normal anterior communicating artery and visualized bilateral ACA branches. Normal visualized MCA branches. IMPRESSION: 1.  Normal MRI appearance of the brain. 2.  Negative intracranial MRA. 3. Mild paranasal sinus inflammation. Electronically Signed   By: Odessa Fleming M.D.   On: 10/21/2015 21:37    Labs:  CBC:  Recent Labs  10/17/15 1813 10/18/15 0357 10/19/15 0540 10/21/15 1539  WBC 9.5 8.5 7.2 6.2  HGB 15.7 15.7 14.0 15.9  HCT 46.5 46.0 41.8 47.2  PLT 231 229 198 214    COAGS: No results for input(s): INR, APTT in the last  8760 hours.  BMP:  Recent Labs  10/17/15 1813 10/18/15 0357 10/19/15 0540 10/21/15 1539  NA 139 138 140 140  K 4.1 4.4 3.8 4.0  CL 105 104 105 105  CO2 24 25 26 26   GLUCOSE 104* 142* 114* 102*  BUN 12 10 12 9   CALCIUM 9.6 9.3 8.9 9.0  CREATININE 0.85 0.95 0.92 0.93  GFRNONAA >60 >60 >60 >60  GFRAA >60 >60 >60 >60    LIVER FUNCTION TESTS:  Recent Labs  10/17/15 1813  BILITOT 0.8  AST 30  ALT 35  ALKPHOS 64  PROT 7.0  ALBUMIN 4.4    TUMOR MARKERS: No results for input(s): AFPTM, CEA, CA199, CHROMGRNA in the last 8760 hours.  Assessment and Plan:  Persistent headache after strept treatment and pleurisy treatment ED LP 9/25 pm-reveals high wbc Positional headache since LP Headache refractory to bedrest; caffeine Now scheduled for blood patch in IR Risks and benefits of procedure discussed with pt; including but not limited to: Infection; bleeding; damage to surrounding structures Agreeable to proceed Consent signed andn chart  Thank you for this interesting consult.  I greatly enjoyed meeting Tyreik Hosking and look forward to participating in their care.  A copy of this report was sent to the requesting provider on this date.  Electronically Signed: Adedamola Seto A 10/24/2015, 10:59 AM   I spent a total of 40 Minutes    in face to face in clinical consultation, greater than 50% of which was counseling/coordinating care for blood patch

## 2015-10-24 NOTE — Discharge Summary (Signed)
Physician Discharge Summary  Jeffery Ferguson ZOX:[id number] DOB: 03/26/1985 DOA: 10/17/2015  PCP: No primary care provider on file.  Admit date: 10/17/2015 Discharge date: 10/24/2015  Admitted From: Home Disposition: Home  Recommendations for Outpatient Follow-up:  1. Follow up with PCP in 1-2 weeks 2. Please obtain BMP/CBC in one week  Home Health:NA Equipment/Devices: NA  Discharge Condition: Stable CODE STATUS: FUll Diet recommendation: Heart Healthy  Brief/Interim Summary: Jeffery Ferguson a 30 y.o.malewith no significantpast medical history who presents with headache for 1 day.  History is collected mostly from the patient's father, at the bedside, as the patient is sleepy from diphenhydramine. Father reports that the patient [place] highways [place] bridges [place], and has had care over the last month from several different urgent cares and emergency rooms. About 2 weeks ago, he was seen somewhere for sore throat, diagnosed with strep, and prescribed amoxicillin. A few days later, he was seen in the ER for severe chest pain, had "CT scans" at other workup that was all negative, and was diagnosed with pleurisy, which the father was given a believe was from the strep throat.  Since then, the patient has completed amoxicillin, and been otherwise well until this morning when he woke up with an posterior headache. This progressed over the course of the day until it was severe in intensity, became associated with nausea and vomiting, chills at times, photophobia, one episode of syncope per patient's mother, and dizziness, and so he came to the hospital.  Discharge Diagnoses:  Principal Problem:   Meningoencephalitis Active Problems:   Spinal headache   Peripheral neuropathy (HCC)   Possible meningitis -Patient presented with headaches, does not have nuchal rigidity now but he reported some on admission. -Temperature of 100.4, afebrile today, continue current  antibiotics. -CSF showed 24 WBCs which is 84-89% neutrophils, protein is 54 and glucose is 59. -CSF Gram stain without any organism. -CSF studies looks like it could be secondary to viral meningitis or partially treated bacterial meningitis. -CSF cultures negative, vancomycin discontinued, ID recommended meropenem for total 7 days. -I appreciate ID help, Arbovirus pending, follow up with ID clinic in 1 month  Headache -The initial headache which is frontal which is resolved. -Has occasional headache position dependent mostly consistent with spinal post LP headache. -I appreciate Dr. Roxy Manns help, IR reported that they do NOT do blood patch procedures on the weekend. -On the day of discharge he was still complaining about headache, IR consulted, blood patch is done. -After the blood patch he was kept 3 hours in bed as IR suggested, discharge home. -Follow up with PCP and ID clinic   Discharge Instructions     Medication List    TAKE these medications   metaxalone 800 MG tablet Commonly known as:  SKELAXIN Take 800 mg by mouth at bedtime as needed for muscle spasms.   omeprazole 20 MG capsule Commonly known as:  PRILOSEC Take 20 mg by mouth 2 (two) times daily.   oxyCODONE-acetaminophen 5-325 MG tablet Commonly known as:  PERCOCET/ROXICET Take 1-2 tablets by mouth every 4 (four) hours as needed for moderate pain.   traMADol 50 MG tablet Commonly known as:  ULTRAM Take 100 mg by mouth 3 (three) times daily as needed for moderate pain.       Allergies  Allergen Reactions  . Penicillins Nausea And Vomiting    Has patient had a PCN reaction causing immediate rash, facial/tongue/throat swelling, SOB or lightheadedness with hypotension: Yes Has patient had a  PCN reaction causing severe rash involving mucus membranes or skin necrosis: No Has patient had a PCN reaction that required hospitalization No Has patient had a PCN reaction occurring within the last 10 years: No If all of  the above answers are "NO", then may proceed with Cephalosporin use.   . Cephalexin Hives    Consultations:  ID.  IR.   Procedures/Studies: Ct Head Wo Contrast  Result Date: 10/17/2015 CLINICAL DATA:  30 y/o M; headache with chills, nausea, and vomiting. EXAM: CT HEAD WITHOUT CONTRAST TECHNIQUE: Contiguous axial images were obtained from the base of the skull through the vertex without intravenous contrast. COMPARISON:  None. FINDINGS: Brain: No evidence of acute infarction, hemorrhage, hydrocephalus, extra-axial collection or mass lesion/mass effect. Vascular: No hyperdense vessel or unexpected calcification. Skull: Normal. Negative for fracture or focal lesion. Sinuses/Orbits: No acute finding. Other: None. IMPRESSION: No acute intracranial abnormality is identified. Unremarkable CT of head for age. Electronically Signed   By: Mitzi Hansen M.D.   On: 10/17/2015 20:17   Mr Angiogram Head Wo Contrast  Result Date: 10/21/2015 CLINICAL DATA:  30 year old male with severe headache, photophobia. Possible meningitis. Status post LP. Initial encounter. EXAM: MRI HEAD WITHOUT AND WITH CONTRAST MRA HEAD WITHOUT CONTRAST TECHNIQUE: Multiplanar, multiecho pulse sequences of the brain and surrounding structures were obtained without and with intravenous contrast. Angiographic images of the head were obtained using MRA technique without contrast. CONTRAST:  20mL MULTIHANCE GADOBENATE DIMEGLUMINE 529 MG/ML IV SOLN COMPARISON:  Head CT without contrast 10/17/2015. FINDINGS: MRI HEAD FINDINGS Brain: Cerebral volume is normal. No restricted diffusion to suggest acute infarction. No midline shift, mass effect, evidence of mass lesion, ventriculomegaly, extra-axial collection or acute intracranial hemorrhage. Cervicomedullary junction and pituitary are within normal limits. Occasional small subcortical white matter nonspecific foci of T2 and FLAIR hyperintensity (right operculum series 7, image 16). No  cerebral edema identified. Otherwise gray and white matter signal is within normal limits. No cortical encephalomalacia or chronic cerebral blood products. No abnormal enhancement following contrast, small right cingulate region developmental venous anomaly (normal variant). No dural thickening identified. Vascular: Major intracranial vascular flow voids appear normal. Skull and upper cervical spine: Negative. Visualized bone marrow signal is within normal limits. Sinuses/Orbits: Negative orbits soft tissues. mild paranasal sinus mucosal thickening. Other: Visible internal auditory structures appear normal. Mastoids are clear. Negative scalp soft tissues. MRA HEAD FINDINGS Antegrade flow in the posterior circulation with mildly dominant distal left vertebral artery. Normal bilateral PICA origins. Normal vertebrobasilar junction. No basilar stenosis. Normal SCA and PCA origins. Both posterior communicating arteries present. Normal PCA branches. Antegrade flow in both ICA siphons. No siphon stenosis. Normal ophthalmic and posterior communicating artery origins. Normal carotid termini, MCA and ACA origins. Normal anterior communicating artery and visualized bilateral ACA branches. Normal visualized MCA branches. IMPRESSION: 1.  Normal MRI appearance of the brain. 2.  Negative intracranial MRA. 3. Mild paranasal sinus inflammation. Electronically Signed   By: Odessa Fleming M.D.   On: 10/21/2015 21:37   Mr Laqueta Jean Or Wo Contrast  Result Date: 10/21/2015 CLINICAL DATA:  30 year old male with severe headache, photophobia. Possible meningitis. Status post LP. Initial encounter. EXAM: MRI HEAD WITHOUT AND WITH CONTRAST MRA HEAD WITHOUT CONTRAST TECHNIQUE: Multiplanar, multiecho pulse sequences of the brain and surrounding structures were obtained without and with intravenous contrast. Angiographic images of the head were obtained using MRA technique without contrast. CONTRAST:  20mL MULTIHANCE GADOBENATE DIMEGLUMINE 529 MG/ML  IV SOLN COMPARISON:  Head CT without contrast 10/17/2015.  FINDINGS: MRI HEAD FINDINGS Brain: Cerebral volume is normal. No restricted diffusion to suggest acute infarction. No midline shift, mass effect, evidence of mass lesion, ventriculomegaly, extra-axial collection or acute intracranial hemorrhage. Cervicomedullary junction and pituitary are within normal limits. Occasional small subcortical white matter nonspecific foci of T2 and FLAIR hyperintensity (right operculum series 7, image 16). No cerebral edema identified. Otherwise gray and white matter signal is within normal limits. No cortical encephalomalacia or chronic cerebral blood products. No abnormal enhancement following contrast, small right cingulate region developmental venous anomaly (normal variant). No dural thickening identified. Vascular: Major intracranial vascular flow voids appear normal. Skull and upper cervical spine: Negative. Visualized bone marrow signal is within normal limits. Sinuses/Orbits: Negative orbits soft tissues. mild paranasal sinus mucosal thickening. Other: Visible internal auditory structures appear normal. Mastoids are clear. Negative scalp soft tissues. MRA HEAD FINDINGS Antegrade flow in the posterior circulation with mildly dominant distal left vertebral artery. Normal bilateral PICA origins. Normal vertebrobasilar junction. No basilar stenosis. Normal SCA and PCA origins. Both posterior communicating arteries present. Normal PCA branches. Antegrade flow in both ICA siphons. No siphon stenosis. Normal ophthalmic and posterior communicating artery origins. Normal carotid termini, MCA and ACA origins. Normal anterior communicating artery and visualized bilateral ACA branches. Normal visualized MCA branches. IMPRESSION: 1.  Normal MRI appearance of the brain. 2.  Negative intracranial MRA. 3. Mild paranasal sinus inflammation. Electronically Signed   By: Odessa FlemingH  Hall M.D.   On: 10/21/2015 21:37    (Echo, Carotid, EGD,  Colonoscopy, ERCP)    Subjective:   Discharge Exam: Vitals:   10/24/15 0515 10/24/15 1000  BP: 113/63 95/81  Pulse: 69 64  Resp: 16 18  Temp: 97.8 F (36.6 C) 98.1 F (36.7 C)   Vitals:   10/23/15 1706 10/23/15 2140 10/24/15 0515 10/24/15 1000  BP: (!) 111/56 127/60 113/63 95/81  Pulse: 79 75 69 64  Resp: 16 18 16 18   Temp: 97.6 F (36.4 C) 98.1 F (36.7 C) 97.8 F (36.6 C) 98.1 F (36.7 C)  TempSrc: Oral Oral Oral Oral  SpO2: 99% 99% 99% 99%  Weight:      Height:        General: Pt is alert, awake, not in acute distress Cardiovascular: RRR, S1/S2 +, no rubs, no gallops Respiratory: CTA bilaterally, no wheezing, no rhonchi Abdominal: Soft, NT, ND, bowel sounds + Extremities: no edema, no cyanosis    The results of significant diagnostics from this hospitalization (including imaging, microbiology, ancillary and laboratory) are listed below for reference.     Microbiology: Recent Results (from the past 240 hour(s))  CSF culture     Status: None   Collection Time: 10/17/15  9:30 PM  Result Value Ref Range Status   Specimen Description CSF  Final   Special Requests NONE  Final   Gram Stain   Final    CYTOSPIN SMEAR WBC PRESENT,BOTH PMN AND MONONUCLEAR NO ORGANISMS SEEN    Culture NO GROWTH 3 DAYS  Final   Report Status 10/21/2015 FINAL  Final     Labs: BNP (last 3 results) No results for input(s): BNP in the last 8760 hours. Basic Metabolic Panel:  Recent Labs Lab 10/17/15 1813 10/18/15 0357 10/19/15 0540 10/21/15 1539  NA 139 138 140 140  K 4.1 4.4 3.8 4.0  CL 105 104 105 105  CO2 24 25 26 26   GLUCOSE 104* 142* 114* 102*  BUN 12 10 12 9   CREATININE 0.85 0.95 0.92 0.93  CALCIUM 9.6 9.3  8.9 9.0   Liver Function Tests:  Recent Labs Lab 10/17/15 1813  AST 30  ALT 35  ALKPHOS 64  BILITOT 0.8  PROT 7.0  ALBUMIN 4.4   No results for input(s): LIPASE, AMYLASE in the last 168 hours. No results for input(s): AMMONIA in the last 168  hours. CBC:  Recent Labs Lab 10/17/15 1813 10/18/15 0357 10/19/15 0540 10/21/15 1539  WBC 9.5 8.5 7.2 6.2  NEUTROABS 7.0  --   --   --   HGB 15.7 15.7 14.0 15.9  HCT 46.5 46.0 41.8 47.2  MCV 89.6 90.6 90.1 90.9  PLT 231 229 198 214   Cardiac Enzymes: No results for input(s): CKTOTAL, CKMB, CKMBINDEX, TROPONINI in the last 168 hours. BNP: Invalid input(s): POCBNP CBG: No results for input(s): GLUCAP in the last 168 hours. D-Dimer No results for input(s): DDIMER in the last 72 hours. Hgb A1c No results for input(s): HGBA1C in the last 72 hours. Lipid Profile No results for input(s): CHOL, HDL, LDLCALC, TRIG, CHOLHDL, LDLDIRECT in the last 72 hours. Thyroid function studies No results for input(s): TSH, T4TOTAL, T3FREE, THYROIDAB in the last 72 hours.  Invalid input(s): FREET3 Anemia work up  Recent Labs  10/21/15 1813  VITAMINB12 512   Urinalysis    Component Value Date/Time   COLORURINE YELLOW 10/17/2015 1837   APPEARANCEUR CLEAR 10/17/2015 1837   LABSPEC 1.025 10/17/2015 1837   PHURINE 8.0 10/17/2015 1837   GLUCOSEU NEGATIVE 10/17/2015 1837   HGBUR NEGATIVE 10/17/2015 1837   BILIRUBINUR NEGATIVE 10/17/2015 1837   KETONESUR NEGATIVE 10/17/2015 1837   PROTEINUR NEGATIVE 10/17/2015 1837   NITRITE NEGATIVE 10/17/2015 1837   LEUKOCYTESUR NEGATIVE 10/17/2015 1837   Sepsis Labs Invalid input(s): PROCALCITONIN,  WBC,  LACTICIDVEN Microbiology Recent Results (from the past 240 hour(s))  CSF culture     Status: None   Collection Time: 10/17/15  9:30 PM  Result Value Ref Range Status   Specimen Description CSF  Final   Special Requests NONE  Final   Gram Stain   Final    CYTOSPIN SMEAR WBC PRESENT,BOTH PMN AND MONONUCLEAR NO ORGANISMS SEEN    Culture NO GROWTH 3 DAYS  Final   Report Status 10/21/2015 FINAL  Final     Time coordinating discharge: Over 30 minutes  SIGNED:   Clint Lipps, MD  [facility] 10/24/2015, 12:46 PM Pager   If  7PM-7AM, please contact night-coverage [email or web address] Password [email or web address]

## 2015-10-26 LAB — ENTEROVIRUS PCR: Enterovirus PCR: POSITIVE — AB

## 2015-10-27 LAB — MISC LABCORP TEST (SEND OUT): Labcorp test code: 806371

## 2015-12-06 ENCOUNTER — Encounter: Payer: Self-pay | Admitting: Internal Medicine

## 2015-12-06 ENCOUNTER — Ambulatory Visit (INDEPENDENT_AMBULATORY_CARE_PROVIDER_SITE_OTHER): Payer: Commercial Managed Care - PPO | Admitting: Internal Medicine

## 2015-12-06 DIAGNOSIS — G0490 Encephalitis and encephalomyelitis, unspecified: Secondary | ICD-10-CM

## 2015-12-06 NOTE — Progress Notes (Signed)
[facility]  Patient Active Problem List   Diagnosis Date Noted  . Meningoencephalitis 10/18/2015    Priority: High  . Spinal headache 10/22/2015  . Peripheral neuropathy (HCC)   . 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 Piriformis syndrome 11/07/2012  . 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 Hay fever 04/25/2012  . Lesion of sciatic nerve 04/25/2012  . LBP (low back pain) 02/18/2012  . Neuralgia neuritis, sciatic nerve 02/18/2012  . Soft tissue lesion of shoulder region 05/28/2011  . Buzzing in ear 05/28/2011    Patient's Medications  New Prescriptions   No medications on file  Previous Medications   METAXALONE (SKELAXIN) 800 MG TABLET    Take 800 mg by mouth at bedtime as needed for muscle spasms.    OMEPRAZOLE (PRILOSEC) 20 MG CAPSULE    Take 20 mg by mouth 2 (two) times daily.   OXYCODONE-ACETAMINOPHEN (PERCOCET/ROXICET) 5-325 MG TABLET    Take 1-2 tablets by mouth every 4 (four) hours as needed for moderate pain.   TRAMADOL (ULTRAM) 50 MG TABLET    Take 100 mg by mouth 3 (three) times daily as needed for moderate pain.   Modified Medications   No medications on file  Discontinued Medications   No medications on file    Subjective: Jeffery Ferguson is in for his hospital follow-up visit. In late September he developed what was felt to be strep throat and was treated with 5 days of antibiotics. His sore throat improved within several days later he had sudden onset of severe headache. He became confused and was admitted. He was started on empiric antibiotics and then underwent lumbar puncture which revealed 24 white blood cells with 89% segmented neutrophils. His protein was slightly elevated at 54. CSF Gram stain and culture were negative his enterovirus PCR was positive but I suspect this result came back after he was discharged. Because of concern that he may have had partially treated bacterial meningitis he received 7 days of  meropenem. 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 He has not had any fever, chills or sweats. He still has occasional occipital headaches that he rates 3-4 on a scale of 1-10. They are improving and coming less frequently  Review of Systems: Review of Systems  Constitutional: Negative for chills, diaphoresis, fever, malaise/fatigue and weight loss.  HENT: Negative for sore throat.   Respiratory: Negative for cough, sputum production and shortness of breath.   Cardiovascular: Negative for chest pain.  Gastrointestinal: Negative for abdominal pain, diarrhea, nausea and vomiting.  Genitourinary: Negative for dysuria and frequency.  Musculoskeletal: Negative for joint pain and myalgias.  Skin: Negative for rash.  Neurological: Positive for headaches. Negative for dizziness.    Past Medical History:  Diagnosis Date  . DVT (deep venous thrombosis) (HCC)     Social History  Substance Use Topics  . Smoking status: Never Smoker  . Smokeless tobacco: Current User    Types: Chew  . Alcohol use No     Comment: occ    Family History  Problem Relation Age of Onset  . Cancer Other   . Heart disease Other     Allergies  Allergen Reactions  . Penicillins Nausea And Vomiting    Has patient had a PCN reaction causing immediate rash, facial/tongue/throat swelling, SOB or lightheadedness with hypotension: Yes Has patient had a PCN reaction causing severe rash involving mucus membranes  or skin necrosis: No Has patient had a PCN reaction that required hospitalization No Has patient had a PCN reaction occurring within the last 10 years: No If all of the above answers are "NO", then may proceed with Cephalosporin use.   . Cephalexin Hives    Objective: Vitals:   12/06/15 1520  BP: (!) 147/88  Pulse: 97  Temp: 98.6 F (37 C)  TempSrc: Oral  Weight: 209 lb 4 oz (94.9 kg)  Height: 6\' 1"  (1.854 m)   Body mass index is  27.61 kg/m.  Physical Exam  Constitutional: He is oriented to person, place, and time.  He appears to be quite healthy.  HENT:  Mouth/Throat: No oropharyngeal exudate.  Eyes: Conjunctivae are normal.  Neck: Neck supple.  Cardiovascular: Normal rate and regular rhythm.   No murmur heard. Pulmonary/Chest: Effort normal and breath sounds normal.  Neurological: He is alert and oriented to person, place, and time. Gait normal.  Skin: No rash noted.  Psychiatric: Mood and affect normal.    Lab Results    Problem List Items Addressed This Visit      High   Meningoencephalitis    I suspect that enterovirus was the cause of his meningoencephalitis. He is recovering uneventfully. I do not recommend any further diagnostic evaluation or treatment. He can follow-up here as needed.          Cliffton AstersJohn Esme Durkin, MD [facility] [phone] pager   [phone] cell 12/06/2015, 3:48 PM

## 2015-12-06 NOTE — Assessment & Plan Note (Signed)
I suspect that enterovirus was the cause of his meningoencephalitis. He is recovering uneventfully. I do not recommend any further diagnostic evaluation or treatment. He can follow-up here as needed.

## 2016-05-02 ENCOUNTER — Other Ambulatory Visit: Payer: Self-pay | Admitting: Specialist

## 2016-05-02 DIAGNOSIS — M5126 Other intervertebral disc displacement, lumbar region: Secondary | ICD-10-CM

## 2016-05-04 ENCOUNTER — Ambulatory Visit
Admission: RE | Admit: 2016-05-04 | Discharge: 2016-05-04 | Disposition: A | Discharge status: Home / Self Care | Payer: Commercial Managed Care - PPO | Source: Ambulatory Visit | Attending: Specialist | Admitting: Specialist

## 2016-05-04 DIAGNOSIS — M5126 Other intervertebral disc displacement, lumbar region: Secondary | ICD-10-CM

## 2016-05-04 MED ORDER — IOPAMIDOL (ISOVUE-M 200) INJECTION 41%: 1.0000 mL | Freq: Once | INTRAMUSCULAR | Status: DC

## 2016-05-04 MED ORDER — METHYLPREDNISOLONE ACETATE 40 MG/ML INJ SUSP (RADIOLOG: 120.0000 mg | Freq: Once | INTRAMUSCULAR | Status: DC

## 2016-05-04 NOTE — Discharge Instructions (Signed)

## 2018-09-02 IMAGING — MR MR HEAD WO/W CM
11 of 14 series · 28 of 48 positions shown · IV contrast (Yes   MULTIHANCE)
Comparison: Head CT without contrast 10/17/2015.

CLINICAL DATA: 30-year-old male with severe headache, photophobia.
Possible meningitis. Status post LP. Initial encounter.

EXAM:
MRI HEAD WITHOUT AND WITH CONTRAST
MRA HEAD WITHOUT CONTRAST
TECHNIQUE: Multiplanar, multiecho pulse sequences of the brain and surrounding
structures were obtained without and with intravenous contrast.
Angiographic images of the head were obtained using MRA technique
without contrast.
CONTRAST:  20mL MULTIHANCE GADOBENATE DIMEGLUMINE 529 MG/ML IV SOLN

[Series 3: DWI · axial · 3.0mm · 1.09mm/px · z∈[-15,+126]mm · 5 of 96 slices shown (1 of 4)]
[im 1/96]
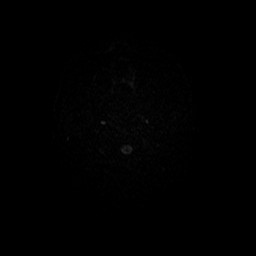
[im 24/96]
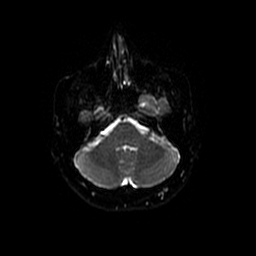
[im 48/96]
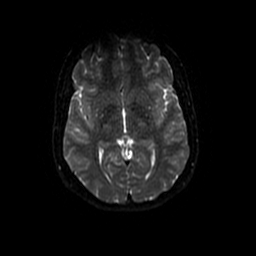
[im 72/96]
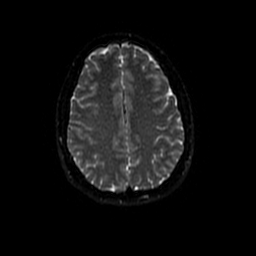
[im 96/96]
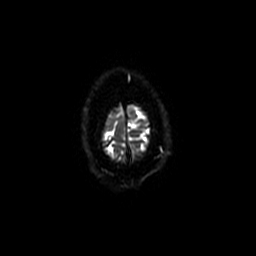

[Series 4: T1 · sagittal · 5.0mm · 0.47mm/px · 1 of 23 slices shown]
[im 1/23]
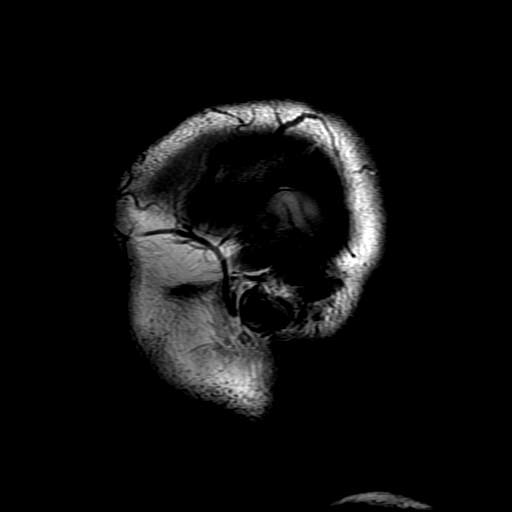

[Series 5: (id) mt fs · axial · 1.2mm · 0.43mm/px · z∈[-33,+16]mm · 4 of 182 slices shown]
[im 1/182]
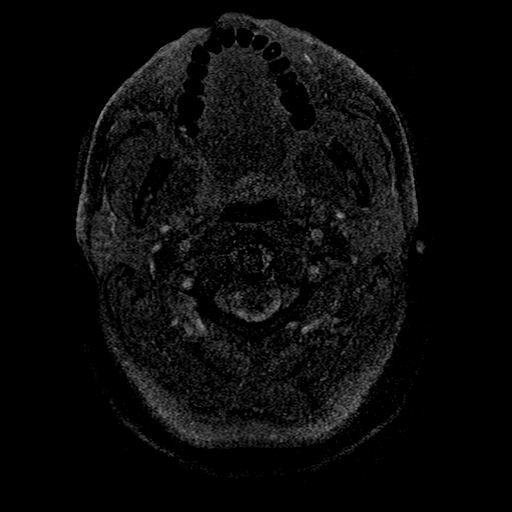
[im 33/182]
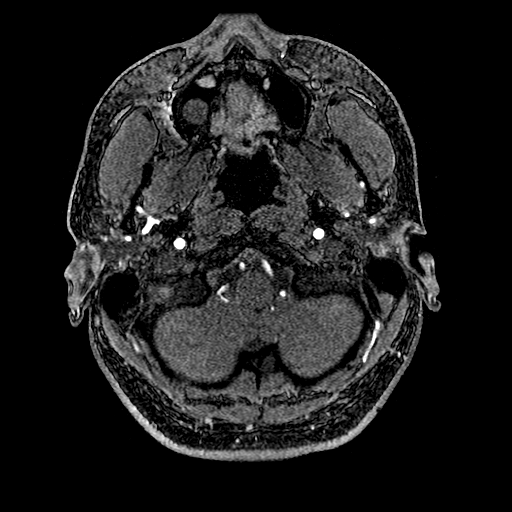
[im 50/182]
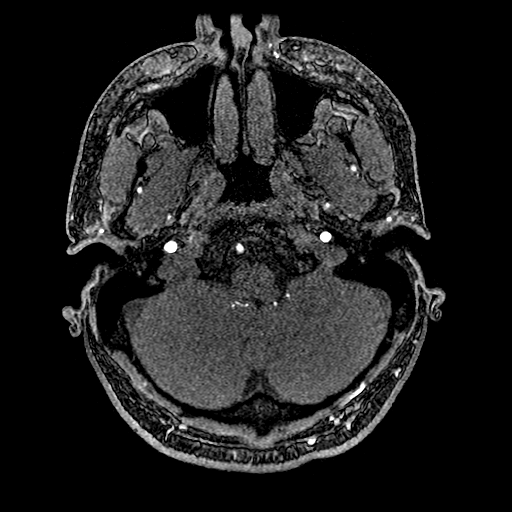
[im 83/182]
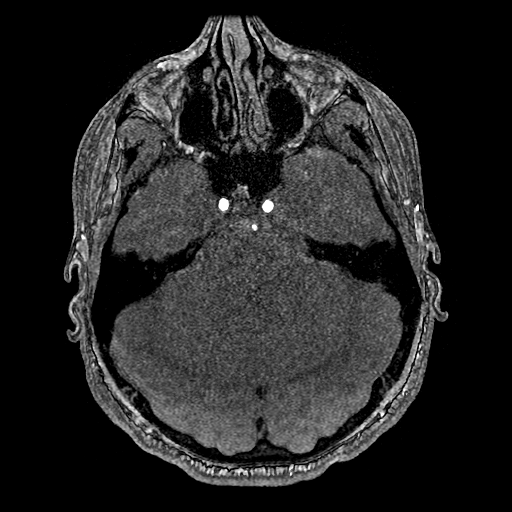

[Series 6: T2 · axial · 5.0mm · 0.43mm/px · z∈[-4,+145]mm · 2 of 26 slices shown]
[im 1/26]
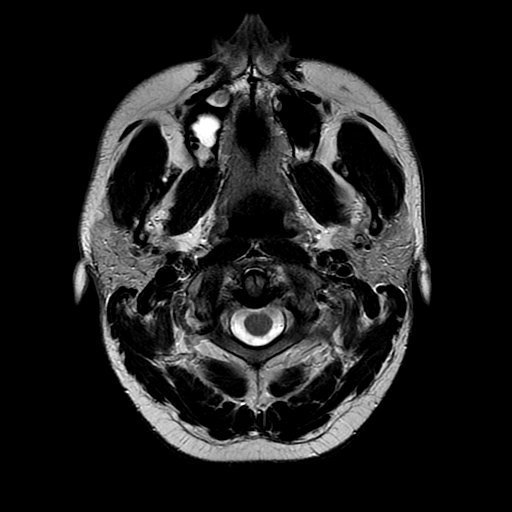
[im 26/26]
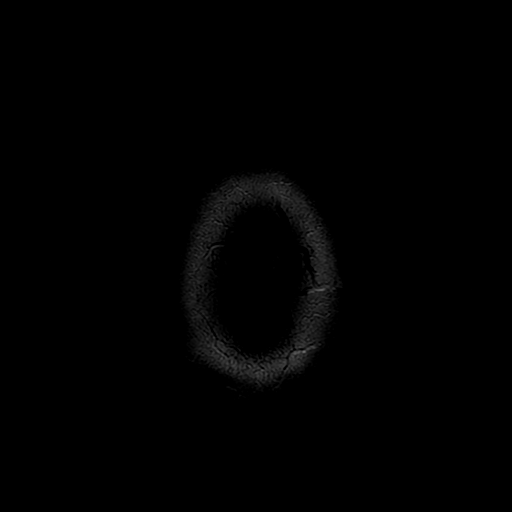

[Series 7: FLAIR · axial · 5.0mm · 0.43mm/px · z∈[-4,+145]mm · 2 of 26 slices shown]
[im 1/26]
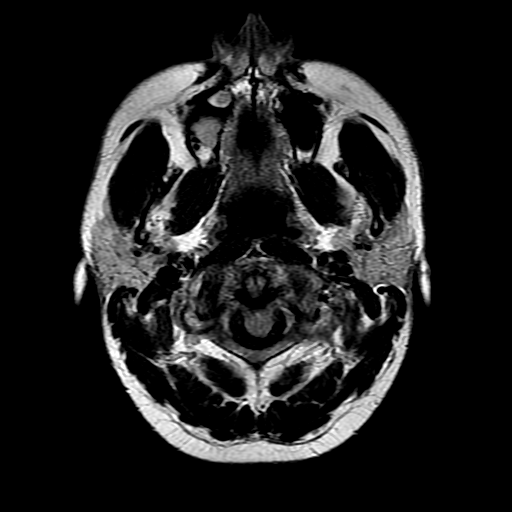
[im 26/26]
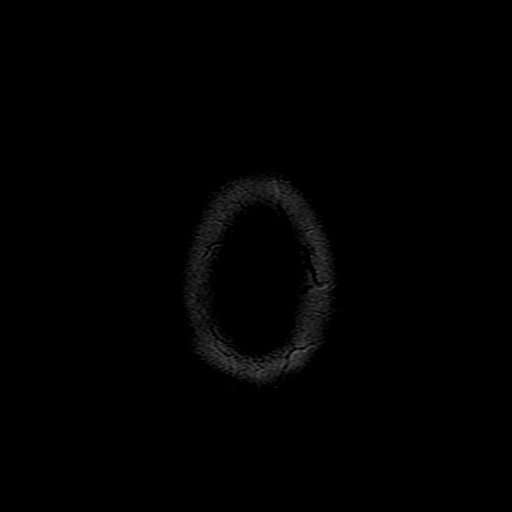

[Series 10: DWI · coronal · 5.0mm · 1.09mm/px · 4 of 66 slices shown (2 of 4)]
[im 1/66]
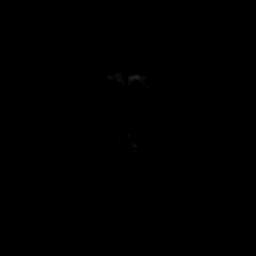
[im 22/66]
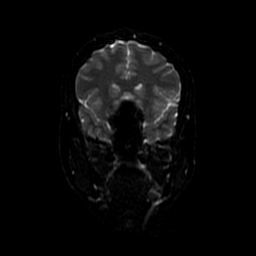
[im 44/66]
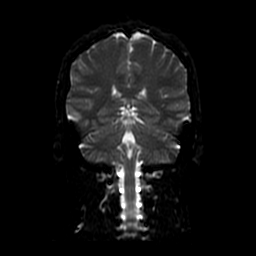
[im 66/66]
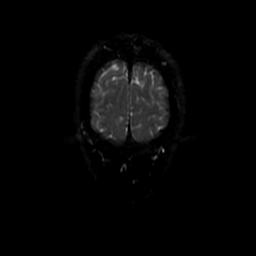

[Series 11: T2 post-contrast · coronal · 5.0mm · 0.39mm/px · 2 of 26 slices shown]
[im 1/26]
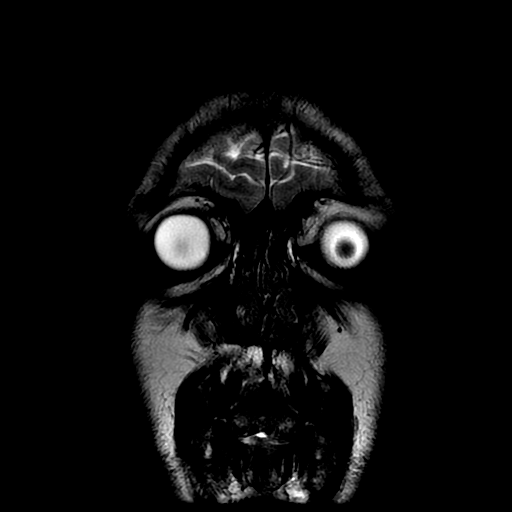
[im 26/26]
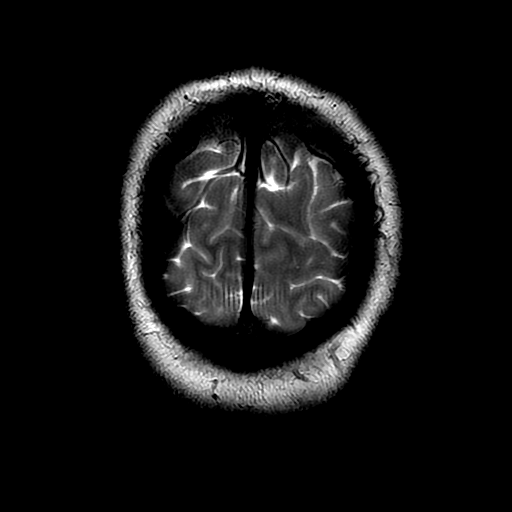

[Series 13: T1 post-contrast · coronal · 5.0mm · 0.39mm/px · 2 of 26 slices shown (1 of 2)]
[im 1/26]
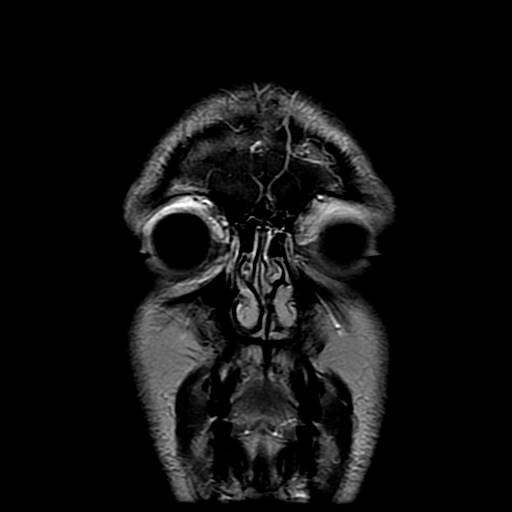
[im 26/26]
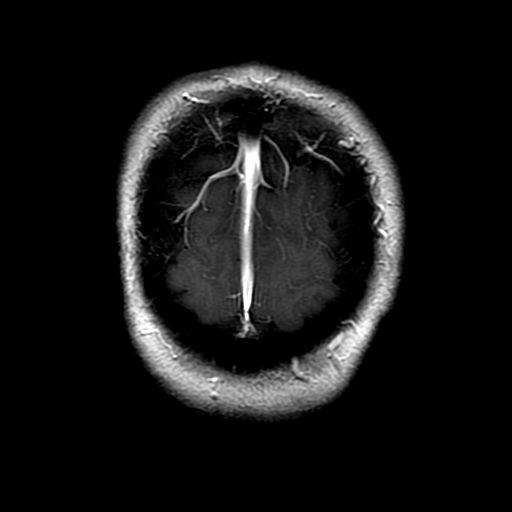

[Series 14: T1 post-contrast · sagittal · 5.0mm · 0.47mm/px · 1 of 23 slices shown (2 of 2)]
[im 1/23]
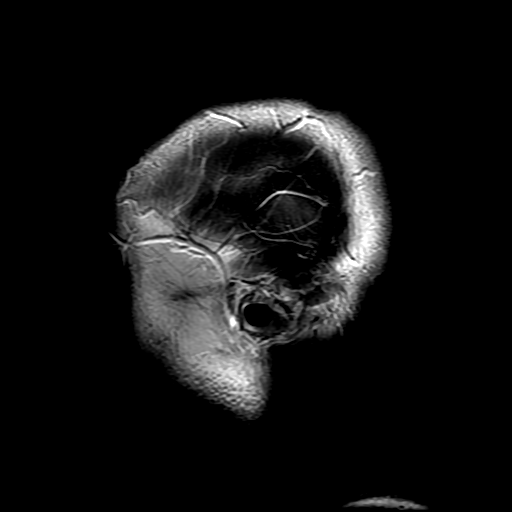

[Series 300: DWI · axial · 3.0mm · 1.09mm/px · z∈[-15,+126]mm · 3 of 48 slices shown (3 of 4)]
[im 1/48]
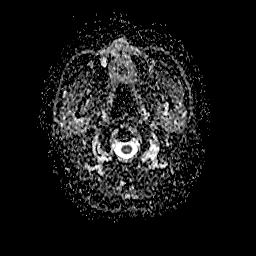
[im 24/48]
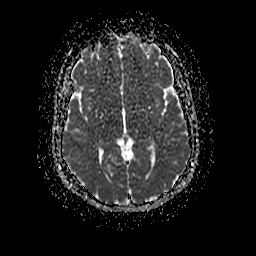
[im 48/48]
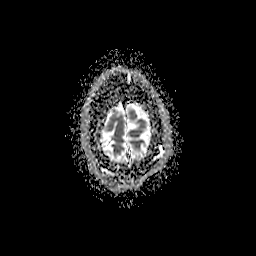

[Series 1000: DWI · coronal · 5.0mm · 1.09mm/px · 2 of 33 slices shown (4 of 4)]
[im 1/33]
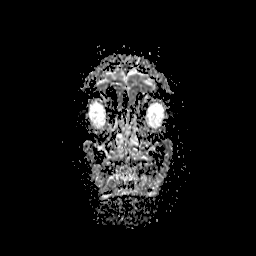
[im 33/33]
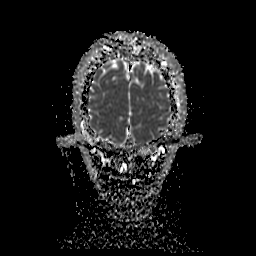

[28 of 48 positions shown; findings below may reference images not displayed]

FINDINGS: MRI HEAD FINDINGS

Brain: Cerebral volume is normal. No restricted diffusion to suggest
acute infarction. No midline shift, mass effect, evidence of mass
lesion, ventriculomegaly, extra-axial collection or acute
intracranial hemorrhage. Cervicomedullary junction and pituitary are
within normal limits.

Occasional small subcortical white matter nonspecific foci of T2 and
FLAIR hyperintensity (right operculum series 7, image 16). No
cerebral edema identified. Otherwise gray and white matter signal is
within normal limits. No cortical encephalomalacia or chronic
cerebral blood products. No abnormal enhancement following contrast,
small right cingulate region developmental venous anomaly (normal
variant). No dural thickening identified.

Vascular: Major intracranial vascular flow voids appear normal.

Skull and upper cervical spine: Negative. Visualized bone marrow
signal is within normal limits.

Sinuses/Orbits: Negative orbits soft tissues. mild paranasal sinus
mucosal thickening.

Other: Visible internal auditory structures appear normal. Mastoids
are clear. Negative scalp soft tissues.

MRA HEAD FINDINGS

Antegrade flow in the posterior circulation with mildly dominant
distal left vertebral artery. Normal bilateral PICA origins. Normal
vertebrobasilar junction. No basilar stenosis. Normal SCA and PCA
origins. Both posterior communicating arteries present. Normal PCA
branches.

Antegrade flow in both ICA siphons. No siphon stenosis. Normal
ophthalmic and posterior communicating artery origins. Normal
carotid termini, MCA and ACA origins. Normal anterior communicating
artery and visualized bilateral ACA branches. Normal visualized MCA
branches.
IMPRESSION: 1.  Normal MRI appearance of the brain.
2.  Negative intracranial MRA.
3. Mild paranasal sinus inflammation.

## 2018-09-05 IMAGING — XA IR FLUORO GUIDE NDL PLMT / BX
2 series · 2 of 2 positions shown · IV contrast (isovue)
Comparison: None.

CLINICAL DATA: Lumbar puncture 1 week ago in the ED, to evaluate
possible meningitis. Persistent positional spinal headache.

EXAM:
LUMBAR EPIDUROGRAM AND BLOOD PATCH
FLUOROSCOPY TIME:  0.1 minute, 3.36 uUymP DAP
TECHNIQUE: The procedure, risks (including but not limited to bleeding,
infection, organ damage ), benefits, and alternatives were explained
to the patient. Questions regarding the procedure were encouraged
and answered. The patient understands and consents to the procedure.
The skin entry site from previous lumbar puncture was localized
corresponding to the space between the L4 and L5 spinous processes.
An appropriate skin entry site was determined. Operator donned
sterile gloves and mask. Skin site was marked, prepped with
Betadine, and draped in usual sterile fashion, and infiltrated
locally with 1% lidocaine. The 20 gauge Crawford needle was advanced
into the lumbar posterior epidural space near the midline using a
right interlaminar approach at L4-6using loss of resistance
technique. Diagnostic injection of 2ml Isovue-M 200 demonstrates
good epidural spread above and below the needle tip and crossing the
midline. No intravascular or subarachnoid component. 15 ml of
autologous blood were then administered as lumbar epidural blood
patch. No immediate complication.

[Series 1: fl (-) angio · 1 of 1 slices shown (1 of 2)]
[im 1/1]
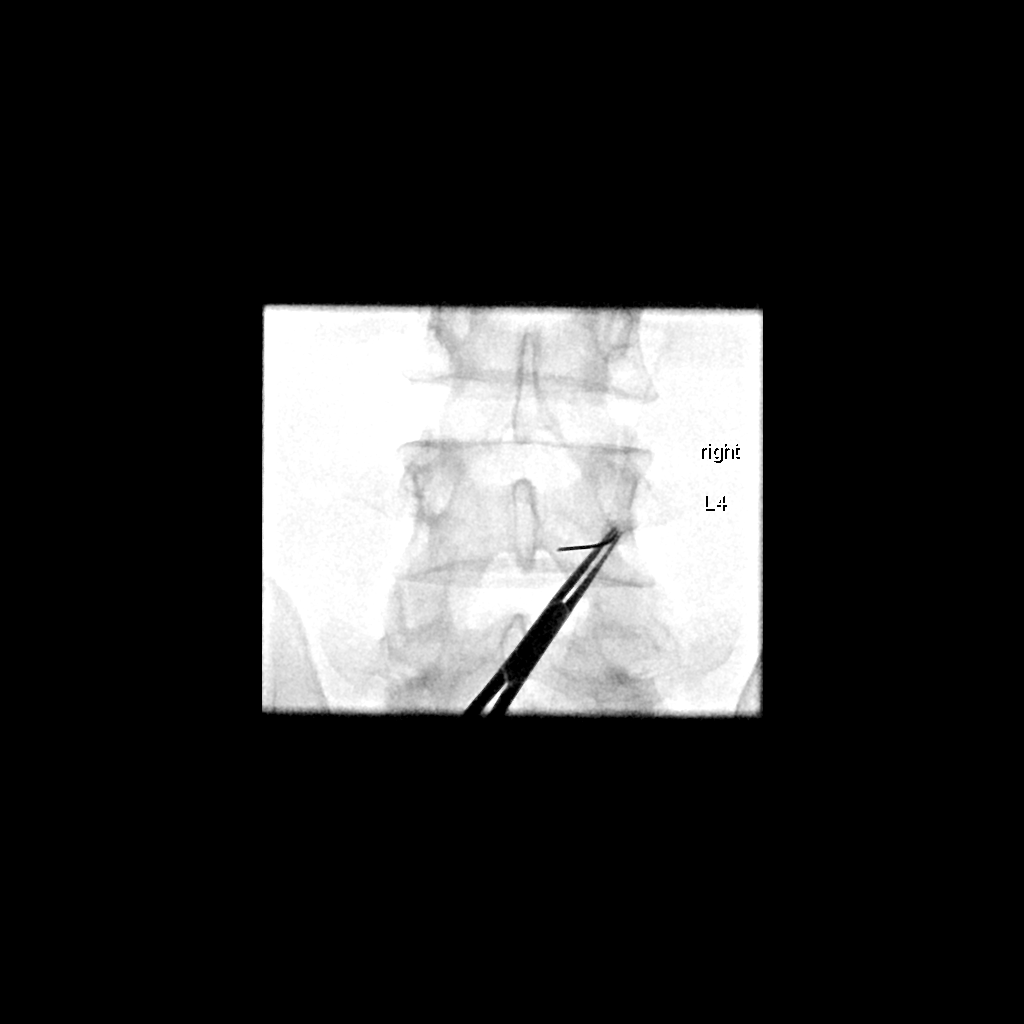

[Series 2: fl (-) angio · 1 of 1 slices shown (2 of 2)]
[im 1/1]
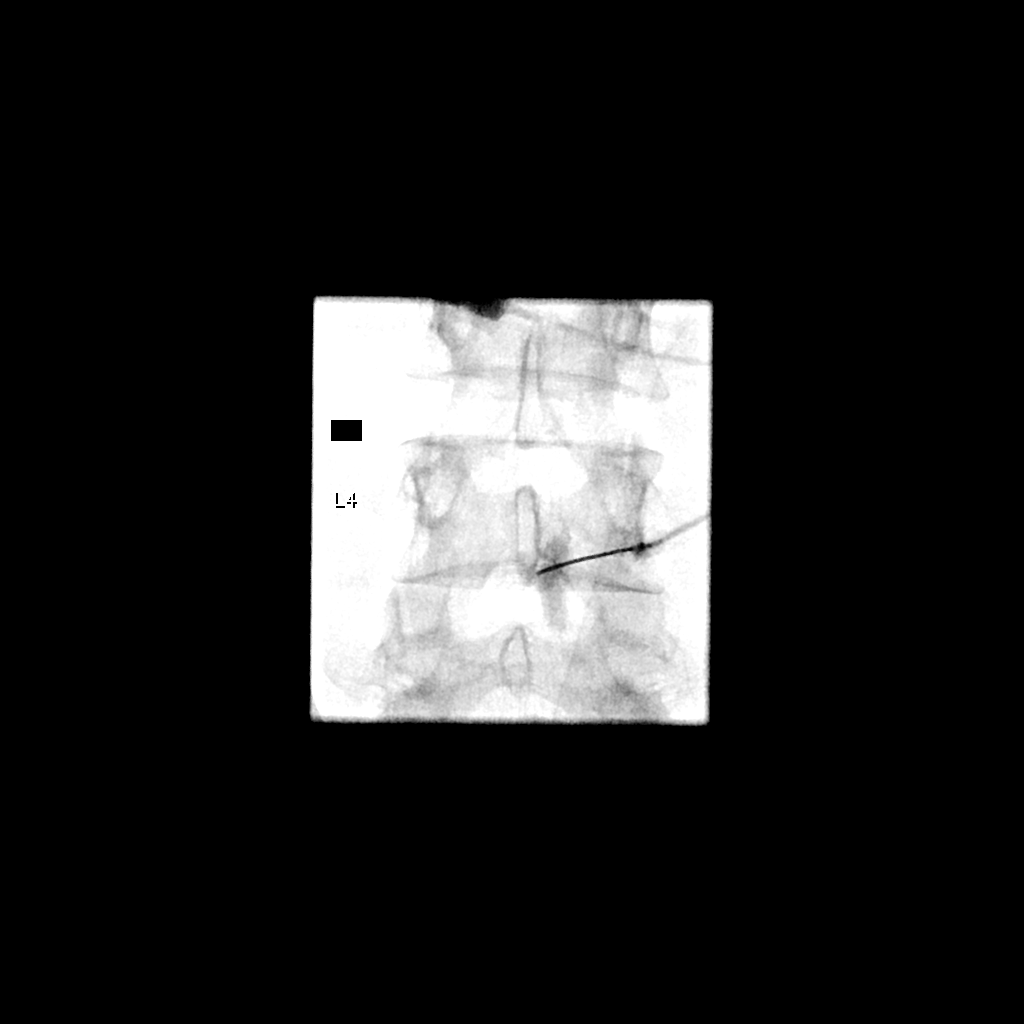

[2 of 2 positions shown; findings below may reference images not displayed]

IMPRESSION: 1. Technically successful lumbar epidural blood patch under
fluoroscopy. The patient was counseled on the importance of laying
flat for an additional 24 hours and maintaining good p.o. fluid
intake.
